# Patient Record
Sex: Female | Born: 1945 | Race: White | Hispanic: No | Marital: Married | Smoking: Never smoker
Health system: Southern US, Community
[De-identification: ages and names within clinical notes are randomized; demographics above are authoritative.]

## PROBLEM LIST (undated history)

## (undated) DIAGNOSIS — I2510 Atherosclerotic heart disease of native coronary artery without angina pectoris: Secondary | ICD-10-CM

## (undated) DIAGNOSIS — I712 Thoracic aortic aneurysm, without rupture: Secondary | ICD-10-CM

## (undated) DIAGNOSIS — I10 Essential (primary) hypertension: Secondary | ICD-10-CM

## (undated) DIAGNOSIS — F329 Major depressive disorder, single episode, unspecified: Secondary | ICD-10-CM

## (undated) DIAGNOSIS — E039 Hypothyroidism, unspecified: Secondary | ICD-10-CM

## (undated) DIAGNOSIS — C8590 Non-Hodgkin lymphoma, unspecified, unspecified site: Secondary | ICD-10-CM

## (undated) DIAGNOSIS — R569 Unspecified convulsions: Secondary | ICD-10-CM

## (undated) DIAGNOSIS — F419 Anxiety disorder, unspecified: Secondary | ICD-10-CM

## (undated) DIAGNOSIS — F32A Depression, unspecified: Secondary | ICD-10-CM

## (undated) DIAGNOSIS — K219 Gastro-esophageal reflux disease without esophagitis: Secondary | ICD-10-CM

## (undated) DIAGNOSIS — K589 Irritable bowel syndrome without diarrhea: Secondary | ICD-10-CM

## (undated) DIAGNOSIS — J309 Allergic rhinitis, unspecified: Secondary | ICD-10-CM

## (undated) HISTORY — DX: Gastro-esophageal reflux disease without esophagitis: K21.9

## (undated) HISTORY — DX: Allergic rhinitis, unspecified: J30.9

## (undated) HISTORY — DX: Hypothyroidism, unspecified: E03.9

## (undated) HISTORY — PX: WRIST CAPSULOTOMY: SHX1090

## (undated) HISTORY — DX: Non-Hodgkin lymphoma, unspecified, unspecified site: C85.90

## (undated) HISTORY — DX: Thoracic aortic aneurysm, without rupture: I71.2

## (undated) HISTORY — DX: Irritable bowel syndrome, unspecified: K58.9

## (undated) HISTORY — DX: Essential (primary) hypertension: I10

## (undated) HISTORY — DX: Atherosclerotic heart disease of native coronary artery without angina pectoris: I25.10

---
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
## 1975-01-06 HISTORY — PX: ABDOMINAL HYSTERECTOMY: SHX81

## 2001-01-05 DIAGNOSIS — I712 Thoracic aortic aneurysm, without rupture: Secondary | ICD-10-CM

## 2001-01-05 DIAGNOSIS — I7122 Aneurysm of the aortic arch, without rupture: Secondary | ICD-10-CM

## 2001-01-05 HISTORY — DX: Thoracic aortic aneurysm, without rupture: I71.2

## 2001-01-05 HISTORY — DX: Aneurysm of the aortic arch, without rupture: I71.22

## 2011-01-06 HISTORY — PX: LAMINECTOMY: SHX219

## 2012-05-16 ENCOUNTER — Encounter: Payer: BC Managed Care – PPO | Admitting: Internal Medicine

## 2012-05-26 ENCOUNTER — Encounter (INDEPENDENT_AMBULATORY_CARE_PROVIDER_SITE_OTHER): Payer: BC Managed Care – PPO | Admitting: Internal Medicine

## 2012-05-26 DIAGNOSIS — C8299 Follicular lymphoma, unspecified, extranodal and solid organ sites: Secondary | ICD-10-CM

## 2012-05-27 ENCOUNTER — Other Ambulatory Visit: Payer: Self-pay | Admitting: Otolaryngology

## 2012-05-27 DIAGNOSIS — H6980 Other specified disorders of Eustachian tube, unspecified ear: Secondary | ICD-10-CM

## 2012-05-27 DIAGNOSIS — H903 Sensorineural hearing loss, bilateral: Secondary | ICD-10-CM

## 2012-05-31 ENCOUNTER — Ambulatory Visit
Admission: RE | Admit: 2012-05-31 | Discharge: 2012-05-31 | Disposition: A | Discharge status: Home / Self Care | Payer: BC Managed Care – PPO | Source: Ambulatory Visit | Attending: Otolaryngology | Admitting: Otolaryngology

## 2012-05-31 DIAGNOSIS — H903 Sensorineural hearing loss, bilateral: Secondary | ICD-10-CM

## 2012-05-31 DIAGNOSIS — H6980 Other specified disorders of Eustachian tube, unspecified ear: Secondary | ICD-10-CM

## 2012-05-31 MED ORDER — IOHEXOL 300 MG/ML  SOLN: 100.0000 mL | Freq: Once | INTRAMUSCULAR | Status: DC | PRN

## 2012-05-31 MED ORDER — IOHEXOL 300 MG/ML  SOLN
75.0000 mL | Freq: Once | INTRAMUSCULAR | Status: AC | PRN
  Administered 2012-05-31: 75 mL via INTRAVENOUS

## 2013-06-08 ENCOUNTER — Encounter: Payer: Self-pay | Admitting: Cardiology

## 2013-06-13 ENCOUNTER — Inpatient Hospital Stay (HOSPITAL_COMMUNITY)
Admission: AD | Admit: 2013-06-13 | Discharge: 2013-06-15 | DRG: 287 | Disposition: A | Discharge status: Home / Self Care | Payer: Medicare Other | Source: Other Acute Inpatient Hospital | Attending: Internal Medicine | Admitting: Internal Medicine

## 2013-06-13 ENCOUNTER — Encounter: Payer: Self-pay | Admitting: *Deleted

## 2013-06-13 ENCOUNTER — Other Ambulatory Visit: Payer: Self-pay | Admitting: *Deleted

## 2013-06-13 ENCOUNTER — Encounter (HOSPITAL_COMMUNITY): Payer: Self-pay | Admitting: Internal Medicine

## 2013-06-13 ENCOUNTER — Ambulatory Visit (INDEPENDENT_AMBULATORY_CARE_PROVIDER_SITE_OTHER): Payer: Medicare Other | Admitting: Cardiology

## 2013-06-13 VITALS — BP 119/81 | HR 82 | Ht 66.0 in | Wt 225.0 lb

## 2013-06-13 DIAGNOSIS — Z79899 Other long term (current) drug therapy: Secondary | ICD-10-CM

## 2013-06-13 DIAGNOSIS — I708 Atherosclerosis of other arteries: Secondary | ICD-10-CM | POA: Diagnosis present

## 2013-06-13 DIAGNOSIS — R079 Chest pain, unspecified: Secondary | ICD-10-CM | POA: Insufficient documentation

## 2013-06-13 DIAGNOSIS — I959 Hypotension, unspecified: Secondary | ICD-10-CM | POA: Diagnosis not present

## 2013-06-13 DIAGNOSIS — I10 Essential (primary) hypertension: Secondary | ICD-10-CM

## 2013-06-13 DIAGNOSIS — K219 Gastro-esophageal reflux disease without esophagitis: Secondary | ICD-10-CM | POA: Diagnosis present

## 2013-06-13 DIAGNOSIS — E785 Hyperlipidemia, unspecified: Secondary | ICD-10-CM | POA: Diagnosis present

## 2013-06-13 DIAGNOSIS — C8589 Other specified types of non-Hodgkin lymphoma, extranodal and solid organ sites: Secondary | ICD-10-CM | POA: Diagnosis present

## 2013-06-13 DIAGNOSIS — E669 Obesity, unspecified: Secondary | ICD-10-CM

## 2013-06-13 DIAGNOSIS — E039 Hypothyroidism, unspecified: Secondary | ICD-10-CM | POA: Diagnosis present

## 2013-06-13 DIAGNOSIS — Z6835 Body mass index (BMI) 35.0-35.9, adult: Secondary | ICD-10-CM

## 2013-06-13 DIAGNOSIS — I200 Unstable angina: Secondary | ICD-10-CM

## 2013-06-13 HISTORY — DX: Essential (primary) hypertension: I10

## 2013-06-13 LAB — MRSA PCR SCREENING: MRSA by PCR: NEGATIVE

## 2013-06-13 MED ORDER — ATORVASTATIN CALCIUM 80 MG PO TABS
80.0000 mg | ORAL_TABLET | Freq: Every day | ORAL | Status: DC
  Administered 2013-06-14: 80 mg via ORAL
  Filled 2013-06-13 (×4): qty 1

## 2013-06-13 MED ORDER — ASPIRIN EC 81 MG PO TBEC
81.0000 mg | DELAYED_RELEASE_TABLET | Freq: Every day | ORAL | Status: DC
Start: 2013-06-14 — End: 2013-06-15
  Administered 2013-06-14: 81 mg via ORAL
  Filled 2013-06-13 (×2): qty 1

## 2013-06-13 MED ORDER — METOPROLOL SUCCINATE ER 50 MG PO TB24
50.0000 mg | ORAL_TABLET | Freq: Every day | ORAL | Status: DC
  Filled 2013-06-13: qty 1

## 2013-06-13 MED ORDER — SODIUM CHLORIDE 0.9 % IV SOLN
INTRAVENOUS | Status: DC
  Administered 2013-06-13: 22:00:00 via INTRAVENOUS

## 2013-06-13 MED ORDER — HEPARIN (PORCINE) IN NACL 100-0.45 UNIT/ML-% IJ SOLN
1000.0000 [IU]/h | INTRAMUSCULAR | Status: DC
  Filled 2013-06-13: qty 250

## 2013-06-13 MED ORDER — LEVOTHYROXINE SODIUM 100 MCG PO TABS
100.0000 ug | ORAL_TABLET | Freq: Every day | ORAL | Status: DC
  Administered 2013-06-14 – 2013-06-15 (×2): 100 ug via ORAL
  Filled 2013-06-13 (×4): qty 1

## 2013-06-13 MED ORDER — HEPARIN (PORCINE) IN NACL 100-0.45 UNIT/ML-% IJ SOLN
1350.0000 [IU]/h | INTRAMUSCULAR | Status: DC
  Administered 2013-06-13: 1150 [IU]/h via INTRAVENOUS
  Filled 2013-06-13 (×2): qty 250

## 2013-06-13 MED ORDER — LEVETIRACETAM 500 MG PO TABS
500.0000 mg | ORAL_TABLET | Freq: Two times a day (BID) | ORAL | Status: DC
  Administered 2013-06-14 (×3): 500 mg via ORAL
  Filled 2013-06-13 (×6): qty 1

## 2013-06-13 MED ORDER — ZOLPIDEM TARTRATE 5 MG PO TABS
5.0000 mg | ORAL_TABLET | Freq: Every evening | ORAL | Status: DC | PRN
  Administered 2013-06-14: 5 mg via ORAL
  Filled 2013-06-13: qty 1

## 2013-06-13 MED ORDER — NITROGLYCERIN 0.4 MG SL SUBL: 0.4000 mg | SUBLINGUAL_TABLET | SUBLINGUAL | Status: DC | PRN

## 2013-06-13 MED ORDER — HEPARIN BOLUS VIA INFUSION
4000.0000 [IU] | Freq: Once | INTRAVENOUS | Status: DC
  Filled 2013-06-13: qty 4000

## 2013-06-13 MED ORDER — FLUOXETINE HCL 20 MG PO CAPS
40.0000 mg | ORAL_CAPSULE | Freq: Every day | ORAL | Status: DC
  Administered 2013-06-14: 40 mg via ORAL
  Filled 2013-06-13 (×2): qty 2

## 2013-06-13 NOTE — Progress Notes (Addendum)
ANTICOAGULATION CONSULT NOTE - Initial Consult  Pharmacy Consult for Heparin Indication: chest pain/ACS  No Known Allergies  Patient Measurements: Height: 5\' 6"  (167.6 cm) Weight: 222 lb (100.699 kg) IBW/kg (Calculated) : 59.3 Heparin Dosing Weight: 82 kg  Vital Signs: Temp: 98.1 F (36.7 C) (06/09 1930) Temp src: Oral (06/09 1930) BP: 103/74 mmHg (06/09 2000) Pulse Rate: 58 (06/09 2000)  Labs: No results found for this basename: HGB, HCT, PLT, APTT, LABPROT, INR, HEPARINUNFRC, CREATININE, CKTOTAL, CKMB, TROPONINI,  in the last 72 hours  CrCl is unknown because no creatinine reading has been taken.   Medical History: Past Medical History  Diagnosis Date  . CAD (coronary artery disease)   . GERD (gastroesophageal reflux disease)   . Hypothyroidism   . Non Hodgkin's lymphoma   . IBS (irritable bowel syndrome)   . 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 Unspecified essential hypertension   . Allergic rhinitis   . HTN (hypertension) 06/13/2013    Assesment: CP over last several weeks 68 y/o F concerning for unstable/accelerating angina in setting of progressing symptoms and abnormal nuclear stress test. LVEF 61%. Patient has a known h/o CAD. Baseline labs pending.   Anticoag: Heparin infusion started at [facility] around 1530 per paperwork. I can not recognized if a bolus was administered or not. Per RN report heparin was turned off by EMS for unknown length of time during transport and until a pump/pole was obtained here. Rate from [facility] is 1160 units/hr (40 uts/ml x 29 ml/hr).   [place] labs: Scr 0.81, H/H 134./40.2, Plts 175  Goal of Therapy:  Heparin level 0.3-0.7 units/ml Monitor platelets by anticoagulation protocol: Yes   Plan:  1. Will convert patient over to our concentration of 100 units/ml and run at 1150 units/hr. Check heparin level in 6 hrs due to unknown time since heparin was resumed.  2. Daily heparin level and CBC   Jaidon Sponsel S. Alford Highland, PharmD,  [facility] Clinical Staff Pharmacist Pager [phone]  Wayland Salinas 06/13/2013,10:17 PM

## 2013-06-13 NOTE — H&P (Signed)
Cardiology History and Physical  SHAH,ASHISH, MD  History of Present Illness (and review of medical records): Jamie Hurst is a 68 y.o. female who presents for evaluation of chest pain.  She has hx of HTN, GERD, hypothyroidism, and obesity.  She recently saw her PCP 2 wks prior for chest pain.  She has been having episodes of chest pain that feel like heaviness across chest.  She has had dyspnea on exertion which is new.  She had severe episode while walking in mall, felt like 8/10 pain, associated with diaphoresis.  Symptoms have been becoming more frequent.  She had been referred for Lexiscan MPI 06/2013 which showed moderate anterior and apical defect with moderate reversibility, LVEF 61%.  She was started on metoporlol, started ASA 81 mg daily by her pcp.  She had initial appointment today with Dr. Harl Bowie in [place] was sent to ED for further evaluation.  She was subsequently transferred to [facility] for further management.  Previous diagnostic testing for coronary artery disease includes: stress thallium. Previous history of cardiac disease includes Angina. Coronary artery disease risk factors include: advanced age (older than 35 for men, 38 for women), hypertension and obesity (BMI >= 30 kg/m2). Patient denies history of CHF, coronary angioplasty, coronary artery stent, previous M.I. and valvular disease.  Review of Systems Further review of systems was otherwise negative other than stated in HPI.  Patient Active Problem List   Diagnosis Date Noted  . Chest pain 06/13/2013  . Unstable angina 06/13/2013  . HTN (hypertension) 06/13/2013  . GERD (gastroesophageal reflux disease) 06/13/2013  . Hypothyroid 06/13/2013   Past Medical History  Diagnosis Date  . CAD (coronary artery disease)   . GERD (gastroesophageal reflux disease)   . Hypothyroidism   . Non Hodgkin's lymphoma   . IBS (irritable bowel syndrome)   . Aneurysm of aortic arch 2003    58mm  . Unspecified essential hypertension   .  Allergic rhinitis   . HTN (hypertension) 06/13/2013    Past Surgical History  Procedure Laterality Date  . Thoracotomy  1968    age 56  . Abdominal hysterectomy  1977  . Laminectomy  2013    x's 2  1st 2003 and 2nd 2013    Prescriptions prior to admission  Medication Sig Dispense Refill  . Butalbital-APAP-Caffeine 50-325-40 MG per capsule Take 1 capsule by mouth daily as needed.      . diphenoxylate-atropine (LOMOTIL) 2.5-0.025 MG per tablet Take 1 tablet by mouth 3 (three) times daily as needed.      Marland Kitchen FLUoxetine (PROZAC) 40 MG capsule Take 40 mg by mouth daily.      Marland Kitchen levETIRAcetam (KEPPRA) 500 MG tablet Take 1 tablet by mouth 2 (two) times daily.      Marland Kitchen levothyroxine (SYNTHROID, LEVOTHROID) 100 MCG tablet Take 1 tablet by mouth daily.      Marland Kitchen LORazepam (ATIVAN) 0.5 MG tablet Take 1 tablet by mouth 2 (two) times daily as needed.       . methocarbamol (ROBAXIN) 500 MG tablet Take 1 tablet by mouth every 8 (eight) hours as needed.       . metoprolol succinate (TOPROL-XL) 50 MG 24 hr tablet Take 1 tablet by mouth daily.      Marland Kitchen zolpidem (AMBIEN) 10 MG tablet Take 1 tablet by mouth daily.       No Known Allergies  History  Substance Use Topics  . Smoking status: Never Smoker   . Smokeless tobacco: Never Used  . Alcohol  Use: Not on file    No family history on file.   Objective:  Patient Vitals for the past 8 hrs:  BP Temp Temp src Pulse Resp SpO2 Height Weight  06/13/13 2000 103/74 mmHg - - 58 16 97 % - -  06/13/13 1930 115/73 mmHg 98.1 F (36.7 C) Oral 59 14 100 % 5\' 6"  (1.676 m) 100.699 kg (222 lb)   General appearance: alert, cooperative, appears stated age and no distress Head: Normocephalic, without obvious abnormality, atraumatic Eyes: conjunctivae/corneas clear. PERRL, EOM's intact. Fundi benign. Neck: no carotid bruit, no JVD and supple, symmetrical, trachea midline Lungs: clear to auscultation bilaterally Chest wall: no tenderness Heart: regular rate and rhythm, S1,  S2 normal, no murmur, click, rub or gallop Abdomen: soft, non-tender; bowel sounds normal; no masses,  no organomegaly Extremities: extremities normal, atraumatic, no cyanosis or edema Pulses: 2+ and symmetric Neurologic: Grossly normal  Results for orders placed during the hospital encounter of 06/13/13 (from the past 48 hour(s))  MRSA PCR SCREENING     Status: None   Collection Time    06/13/13  9:20 PM      Result Value Ref Range   MRSA by PCR NEGATIVE  NEGATIVE   Comment:            The GeneXpert MRSA Assay (FDA     approved for NASAL specimens     only), is one component of a     comprehensive MRSA colonization     surveillance program. It is not     intended to diagnose MRSA     infection nor to guide or     monitor treatment for     MRSA infections.   No results found.  ECG:  Pending, normal sinus rhythm on telemetry  Assessment: Unstable angina with abnormal stress test HTN Obesity Hypothyroidism  Plan: 1. Cardiology Admission  2. Continuous monitoring on Telemetry. 3. Repeat ekg on admit, prn chest pain or arrythmia 4. cycle cardiac biomarkers, check lipids, hgba1c, tsh 5. Medical management to include ASA, Heparin gt, BB, Statin, NTG prn 6. Keep NPO for further likely invasive assessment with cardiac catheterization. 7. Patient agreeable to proceed.

## 2013-06-13 NOTE — Progress Notes (Signed)
Received patient into 2c15 with c/o chest pain. No pain upon arrival. Oriented to rm. CHG bath completed. Iv pump ordered to infuse Heparin drip. End of shift report given to Kathyrn Drown.

## 2013-06-13 NOTE — Patient Instructions (Signed)
   To [facility] ED for evaluation per Dr. Harl Bowie

## 2013-06-13 NOTE — Progress Notes (Signed)
Clinical Summary Jamie Hurst is a 68 y.o.female seen today as a new patient.   1. Chest pain - started approx 3 weeks ago. Episode while in mall where she  became very diaphoretic. Aching pain in midchest, 8/10. No other symptoms. Lasted just a few minutes and then resolved on its own.  - repeat episode last weekend, discomfort in chest while at rest. Not positional, not pleuritic.  - approx 5 episodes over the last few weeks. Has noted some increased DOE and fatigue as well over this same time period. Chest discomfort on Sunday, as well as some mild intermittent symptoms today.     - seen by her pcp Dr Manuella Ghazi initially, referred for [facility] MPI 06/2013 which showed moderate anterior and apical defect with moderate reversibility, LVEF 61%. Dtarted on metoporlol, started ASA 81 mg daily by her pcp and referred to [place].  CAD risk factors: maternal grandfather MIs unsure age , paternal grandfather MI age 81. Brother MI 17. Maternal uncle MI early 39s    Past Medical History  Diagnosis Date  . CAD (coronary artery disease)   . GERD (gastroesophageal reflux disease)   . Hypothyroidism   . Non Hodgkin's lymphoma   . IBS (irritable bowel syndrome)   . Aneurysm of aortic arch 2003    40mm  . Unspecified essential hypertension   . Allergic rhinitis      Allergies no known allergies   Current Outpatient Prescriptions  Medication Sig Dispense Refill  . atenolol (TENORMIN) 50 MG tablet Take 1 tablet by mouth daily.      . Butalbital-APAP-Caffeine 50-325-40 MG per capsule Take 1 capsule by mouth daily as needed.      . diphenoxylate-atropine (LOMOTIL) 2.5-0.025 MG per tablet Take 1 tablet by mouth 3 (three) times daily as needed.      . levETIRAcetam (KEPPRA) 500 MG tablet Take 1 tablet by mouth 2 (two) times daily.      Marland Kitchen levothyroxine (SYNTHROID, LEVOTHROID) 100 MCG tablet Take 1 tablet by mouth daily.      Marland Kitchen LORazepam (ATIVAN) 0.5 MG tablet Take 1 tablet by mouth 2 (two) times  daily.      . methocarbamol (ROBAXIN) 500 MG tablet Take 1 tablet by mouth 3 (three) times daily.      . metoprolol succinate (TOPROL-XL) 50 MG 24 hr tablet Take 1 tablet by mouth daily.      Marland Kitchen zolpidem (AMBIEN) 10 MG tablet Take 1 tablet by mouth daily.       No current facility-administered medications for this visit.     No past surgical history on file.   Allergies no known allergies    No family history on file.   Social History Jamie Hurst has no tobacco history on file. Jamie Hurst has no alcohol history on file.   Review of Systems CONSTITUTIONAL: No weight loss, fever, chills, weakness or fatigue.  HEENT: Eyes: No visual loss, blurred vision, double vision or yellow sclerae.No hearing loss, sneezing, congestion, runny nose or sore throat.  SKIN: No rash or itching.  CARDIOVASCULAR: per HPI RESPIRATORY: No shortness of breath, cough or sputum.  GASTROINTESTINAL: No anorexia, nausea, vomiting or diarrhea. No abdominal pain or blood.  GENITOURINARY: No burning on urination, no polyuria NEUROLOGICAL: No headache, dizziness, syncope, paralysis, ataxia, numbness or tingling in the extremities. No change in bowel or bladder control.  MUSCULOSKELETAL: No muscle, back pain, joint pain or stiffness.  LYMPHATICS: No enlarged nodes. No history of splenectomy.  PSYCHIATRIC:  No history of depression or anxiety.  ENDOCRINOLOGIC: No reports of sweating, cold or heat intolerance. No polyuria or polydipsia.  Marland Kitchen   Physical Examination p 82 bp 119/81 Wt 225 lbs BMI 36 Gen: resting comfortably, no acute distress HEENT: no scleral icterus, pupils equal round and reactive, no palptable cervical adenopathy,  CV: RRR, no m/r/g, no JVD, no carotid bruits Resp: Clear to auscultation bilaterally GI: abdomen is soft, non-tender, non-distended, normal bowel sounds, no hepatosplenomegaly MSK: extremities are warm, no edema.  Skin: warm, no rash Neuro:  no focal deficits Psych:  appropriate affect   Diagnostic Studies  Pertinent labs 06/06/13 Hgb 12.8 Hct 38.9 Plt 233 D-dimer 0.5, TSH 3.1, Cr 0.9 BUN 14 GFR 67 K 5.1  06/08/13 Lexiscan MPI Moderate defect in anterior wall and apex with moderate reversibility, LVEF 61%, normal wall motion.   06/13/13 Clinic EKG NSR, no acute ischemic changes.  Assessment and Plan  1. Chest pain - concerning for unstable/accelerating angina in setting of progressing symptoms and abnormal nuclear stress test. - discussed with patient in detail. Will have her go to [facility] ER to initiate medical management with plans for transfer to [facility] ultimately for cath - discussed with [facility] ER attending these plans as well.      Arnoldo Lenis, M.D., F.A.C.C.

## 2013-06-13 NOTE — Progress Notes (Signed)
Dr. Colon Flattery notified of pt arrival.

## 2013-06-14 ENCOUNTER — Encounter (HOSPITAL_COMMUNITY)
Admission: AD | Disposition: A | Discharge status: Home / Self Care | Payer: Self-pay | Source: Other Acute Inpatient Hospital | Attending: Internal Medicine

## 2013-06-14 ENCOUNTER — Encounter (HOSPITAL_COMMUNITY): Payer: Self-pay | Admitting: *Deleted

## 2013-06-14 ENCOUNTER — Other Ambulatory Visit: Payer: Self-pay

## 2013-06-14 DIAGNOSIS — I200 Unstable angina: Principal | ICD-10-CM

## 2013-06-14 DIAGNOSIS — R9430 Abnormal result of cardiovascular function study, unspecified: Secondary | ICD-10-CM

## 2013-06-14 DIAGNOSIS — E669 Obesity, unspecified: Secondary | ICD-10-CM

## 2013-06-14 DIAGNOSIS — E785 Hyperlipidemia, unspecified: Secondary | ICD-10-CM

## 2013-06-14 HISTORY — PX: LEFT HEART CATHETERIZATION WITH CORONARY ANGIOGRAM: SHX5451

## 2013-06-14 LAB — CBC
HEMATOCRIT: 38 % (ref 36.0–46.0)
Hemoglobin: 11.9 g/dL — ABNORMAL LOW (ref 12.0–15.0)
MCH: 28.7 pg (ref 26.0–34.0)
MCHC: 31.3 g/dL (ref 30.0–36.0)
MCV: 91.8 fL (ref 78.0–100.0)
Platelets: 154 10*3/uL (ref 150–400)
RBC: 4.14 MIL/uL (ref 3.87–5.11)
RDW: 13.5 % (ref 11.5–15.5)
WBC: 3.2 10*3/uL — ABNORMAL LOW (ref 4.0–10.5)

## 2013-06-14 LAB — BASIC METABOLIC PANEL
BUN: 12 mg/dL (ref 6–23)
CO2: 23 mEq/L (ref 19–32)
Calcium: 9.1 mg/dL (ref 8.4–10.5)
Chloride: 107 mEq/L (ref 96–112)
Creatinine, Ser: 0.73 mg/dL (ref 0.50–1.10)
GFR, EST NON AFRICAN AMERICAN: 88 mL/min — AB (ref >90)
Glucose, Bld: 94 mg/dL (ref 70–99)
Potassium: 4.2 mEq/L (ref 3.7–5.3)
Sodium: 143 mEq/L (ref 137–147)

## 2013-06-14 LAB — PROTIME-INR
INR: 1.04 (ref 0.00–1.49)
Prothrombin Time: 13.4 seconds (ref 11.6–15.2)

## 2013-06-14 LAB — HEPARIN LEVEL (UNFRACTIONATED)
HEPARIN UNFRACTIONATED: 0.17 [IU]/mL — AB (ref 0.30–0.70)
Heparin Unfractionated: 0.57 IU/mL (ref 0.30–0.70)

## 2013-06-14 LAB — POCT ACTIVATED CLOTTING TIME: ACTIVATED CLOTTING TIME: 116 s

## 2013-06-14 LAB — TROPONIN I
Troponin I: <0.3 ng/mL (ref <0.30)
Troponin I: <0.3 ng/mL (ref <0.30)
Troponin I: <0.3 ng/mL (ref <0.30)

## 2013-06-14 LAB — LIPID PANEL
Cholesterol: 236 mg/dL — ABNORMAL HIGH (ref 0–200)
HDL: 68 mg/dL (ref >39)
LDL CALC: 134 mg/dL — AB (ref 0–99)
TRIGLYCERIDES: 168 mg/dL — AB (ref <150)
Total CHOL/HDL Ratio: 3.5 RATIO
VLDL: 34 mg/dL (ref 0–40)

## 2013-06-14 LAB — GLUCOSE, CAPILLARY: Glucose-Capillary: 82 mg/dL (ref 70–99)

## 2013-06-14 LAB — MAGNESIUM: Magnesium: 2 mg/dL (ref 1.5–2.5)

## 2013-06-14 LAB — HEMOGLOBIN A1C
Hgb A1c MFr Bld: 5.6 % (ref <5.7)
Mean Plasma Glucose: 114 mg/dL (ref <117)

## 2013-06-14 SURGERY — LEFT HEART CATHETERIZATION WITH CORONARY ANGIOGRAM
Anesthesia: LOCAL

## 2013-06-14 MED ORDER — ASPIRIN 81 MG PO CHEW: 81.0000 mg | CHEWABLE_TABLET | ORAL | Status: DC

## 2013-06-14 MED ORDER — AMLODIPINE BESYLATE 2.5 MG PO TABS
2.5000 mg | ORAL_TABLET | Freq: Every day | ORAL | Status: DC
  Administered 2013-06-14 – 2013-06-15 (×2): 2.5 mg via ORAL
  Filled 2013-06-14 (×2): qty 1

## 2013-06-14 MED ORDER — NITROGLYCERIN 0.2 MG/ML ON CALL CATH LAB
INTRAVENOUS | Status: AC
  Filled 2013-06-14: qty 1

## 2013-06-14 MED ORDER — HEPARIN (PORCINE) IN NACL 2-0.9 UNIT/ML-% IJ SOLN
INTRAMUSCULAR | Status: AC
  Filled 2013-06-14: qty 1500

## 2013-06-14 MED ORDER — MIDAZOLAM HCL 2 MG/2ML IJ SOLN
INTRAMUSCULAR | Status: AC
  Filled 2013-06-14: qty 2

## 2013-06-14 MED ORDER — LIDOCAINE HCL (PF) 1 % IJ SOLN
INTRAMUSCULAR | Status: AC
  Filled 2013-06-14: qty 30

## 2013-06-14 MED ORDER — ASPIRIN 81 MG PO CHEW
81.0000 mg | CHEWABLE_TABLET | Freq: Every day | ORAL | Status: DC
  Administered 2013-06-15: 09:00:00 81 mg via ORAL
  Filled 2013-06-14: qty 1

## 2013-06-14 MED ORDER — FENTANYL CITRATE 0.05 MG/ML IJ SOLN
INTRAMUSCULAR | Status: AC
  Filled 2013-06-14: qty 2

## 2013-06-14 MED ORDER — SODIUM CHLORIDE 0.9 % IV SOLN
250.0000 mL | INTRAVENOUS | Status: DC | PRN
Start: 2013-06-14 — End: 2013-06-14

## 2013-06-14 MED ORDER — SODIUM CHLORIDE 0.9 % IJ SOLN
3.0000 mL | Freq: Two times a day (BID) | INTRAMUSCULAR | Status: DC
  Administered 2013-06-14: 3 mL via INTRAVENOUS

## 2013-06-14 MED ORDER — SODIUM CHLORIDE 0.9 % IV SOLN: 1.0000 mL/kg/h | INTRAVENOUS | Status: AC

## 2013-06-14 MED ORDER — SODIUM CHLORIDE 0.9 % IV SOLN: INTRAVENOUS | Status: DC

## 2013-06-14 MED ORDER — ACETAMINOPHEN 500 MG PO TABS: 1000.0000 mg | ORAL_TABLET | Freq: Four times a day (QID) | ORAL | Status: DC | PRN

## 2013-06-14 MED ORDER — HYDROCODONE-ACETAMINOPHEN 5-325 MG PO TABS
1.0000 | ORAL_TABLET | Freq: Once | ORAL | Status: AC
  Administered 2013-06-14: 21:00:00 1 via ORAL
  Filled 2013-06-14: qty 1

## 2013-06-14 MED ORDER — SODIUM CHLORIDE 0.9 % IJ SOLN
3.0000 mL | INTRAMUSCULAR | Status: DC | PRN
Start: 2013-06-14 — End: 2013-06-14

## 2013-06-14 MED ORDER — METOPROLOL SUCCINATE ER 25 MG PO TB24
25.0000 mg | ORAL_TABLET | Freq: Every day | ORAL | Status: DC
  Filled 2013-06-14: qty 1

## 2013-06-14 NOTE — Progress Notes (Signed)
ANTICOAGULATION CONSULT NOTE - Follow Up Consult  Pharmacy Consult for Heparin  Indication: chest pain/ACS  No Known Allergies  Patient Measurements: Height: 5\' 6"  (167.6 cm) Weight: 222 lb (100.699 kg) IBW/kg (Calculated) : 59.3 Heparin Dosing Weight: 82 kg  Vital Signs: Temp: 97.8 F (36.6 C) (06/10 1100) Temp src: Oral (06/10 0739) BP: 108/74 mmHg (06/10 1200) Pulse Rate: 67 (06/10 1200)  Labs:  Recent Labs  06/13/13 2251 06/14/13 0323 06/14/13 1100 06/14/13 1310  HGB  --  11.9*  --   --   HCT  --  38.0  --   --   PLT  --  154  --   --   LABPROT  --  13.4  --   --   INR  --  1.04  --   --   HEPARINUNFRC  --  0.17*  --  0.57  CREATININE  --  0.73  --   --   TROPONINI <0.30 <0.30 <0.30  --    Medications:  Heparin gtt  Assessment: 68 y/o F on heparin for chest pain , heparin level is therapeutic.  CBC is stable, mild anemia noted. No acute bleeding.  Goal of Therapy:  Heparin level 0.3-0.7 units/ml Monitor platelets by anticoagulation protocol: Yes   Plan:  -Continue heparin gtt 1350 units/hr -Daily CBC/HL -Monitor for s/sx bleeding    Hughes Better, PharmD, BCPS Clinical Pharmacist Pager: [phone] 06/14/2013 1:47 PM

## 2013-06-14 NOTE — Progress Notes (Signed)
Utilization Review Completed.  

## 2013-06-14 NOTE — H&P (View-Only) (Signed)
Subjective: 5/10 CP currently  Objective: Vital signs in last 24 hours: Temp:  [97.6 F (36.4 C)-98.1 F (36.7 C)] 97.6 F (36.4 C) (06/10 0739) Pulse Rate:  [54-82] 70 (06/10 0739) Resp:  [13-18] 14 (06/10 0739) BP: (101-119)/(56-81) 110/66 mmHg (06/10 0739) SpO2:  [95 %-100 %] 99 % (06/10 0739) Weight:  [222 lb (100.699 kg)-225 lb (102.059 kg)] 222 lb (100.699 kg) (06/09 1930) Last BM Date: 06/12/13  Intake/Output from previous day: 06/09 0701 - 06/10 0700 In: 656.8 [I.V.:656.8] Out: 500 [Urine:500] Intake/Output this shift:    Medications Current Facility-Administered Medications  Medication Dose Route Frequency Provider Last Rate Last Dose  . aspirin EC tablet 81 mg  81 mg Oral Daily Alwyn Pea, MD      . atorvastatin (LIPITOR) tablet 80 mg  80 mg Oral q1800 Alwyn Pea, MD   80 mg at 06/14/13 0014  . FLUoxetine (PROZAC) capsule 40 mg  40 mg Oral Daily Alwyn Pea, MD      . heparin ADULT infusion 100 units/mL (25000 units/250 mL)  1,350 Units/hr Intravenous Continuous Narda Bonds, RPH 13.5 mL/hr at 06/14/13 0700 1,350 Units/hr at 06/14/13 0700  . levETIRAcetam (KEPPRA) tablet 500 mg  500 mg Oral BID Alwyn Pea, MD   500 mg at 06/14/13 0014  . levothyroxine (SYNTHROID, LEVOTHROID) tablet 100 mcg  100 mcg Oral QAC breakfast Alwyn Pea, MD   100 mcg at 06/14/13 4403  . metoprolol succinate (TOPROL-XL) 24 hr tablet 50 mg  50 mg Oral Daily Alwyn Pea, MD      . nitroGLYCERIN (NITROSTAT) SL tablet 0.4 mg  0.4 mg Sublingual Q5 Min x 3 PRN Alwyn Pea, MD      . zolpidem (AMBIEN) tablet 5 mg  5 mg Oral QHS PRN Alwyn Pea, MD   5 mg at 06/14/13 0014    PE: General appearance: alert, cooperative and no distress Lungs: clear to auscultation bilaterally Heart: regular rate and rhythm, S1, S2 normal, no murmur, click, rub or gallop Extremities: No LEE Pulses: 2+ and symmetric Skin: Warm and dry Neurologic: Grossly  normal  Lab Results:   Recent Labs  06/14/13 0323  WBC 3.2*  HGB 11.9*  HCT 38.0  PLT 154   BMET  Recent Labs  06/14/13 0323  NA 143  K 4.2  CL 107  CO2 23  GLUCOSE 94  BUN 12  CREATININE 0.73  CALCIUM 9.1   PT/INR  Recent Labs  06/14/13 0323  LABPROT 13.4  INR 1.04   Cholesterol  Recent Labs  06/14/13 0323  CHOL 236*   Lipid Panel     Component Value Date/Time   CHOL 236* 06/14/2013 0323   TRIG 168* 06/14/2013 0323   HDL 68 06/14/2013 0323   CHOLHDL 3.5 06/14/2013 0323   VLDL 34 06/14/2013 0323   LDLCALC 134* 06/14/2013 0323     Cardiac Panel (last 3 results)  Recent Labs  06/13/13 2251 06/14/13 0323  TROPONINI <0.30 <0.30       Assessment/Plan 69 y.o. female who presents for evaluation of chest pain. She has hx of HTN, GERD, hypothyroidism, and obesity. She recently saw her PCP 2 wks prior for chest pain. She has been having episodes of chest pain that feel like heaviness across chest. She has had dyspnea on exertion which is new. She had severe episode while walking in mall, felt like 8/10 pain, associated with diaphoresis. Symptoms have been becoming more frequent. She had been referred for [facility] MPI 06/2013 which  showed moderate anterior and apical defect with moderate reversibility, LVEF 61%. She was started on metoporlol, started ASA 81 mg daily by her pcp. She had initial appointment today with Dr. Harl Bowie in [place] was sent to ED for further evaluation. She was subsequently transferred to [facility] for further management.  Principal Problem:   Unstable angina No MI thus far but is still having 5/10 CP.   Recent positive nuc at [facility] hosp.(moderate anterior and apical defect with moderate reversibility, LVEF 61%.).  Left heart cath today.  NPO now.  I would favor adding IV NTG if her BP would tolerate it but currently she is low.   On IV heparin.  No acute EKG changes othe than left axis deviation.   Active Problems:   HTN  (hypertension)  Hypotensive this morning.  Has not has Toprol 50 yet this morning.  Will decrease to 25mg  with parameters.     HLD    Lipitor 80mg .      Obesity  Discussed weight loss,  Carb restriction   GERD (gastroesophageal reflux disease)   Hypothyroid  On levothyroxine    LOS: 1 day    HAGER, BRYAN PA-C 06/14/2013 10:39 AM   Patient seen and examined. Agree with assessment and plan. Very pleasant 68 yo WF with a several week history of exertional chest pressure and recent scintigraphic evidence of anterior and apical defect on nuclear imaging. She presents with unstable angina and has residual discomfort presently. Initial troponins are negative and ECG's are without changes. She is currently on iv heparin; BP in mid 90's. Will hydrate with NS and if BP allows start NTG. Discussed cath and possible PCI with pt and husband. 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 She also underwent thorocotomy at age 33. I have added pt to cath schedule for today.   Troy Sine, MD, [facility] 06/14/2013 11:19 AM

## 2013-06-14 NOTE — CV Procedure (Signed)
PROCEDURE:  Left heart catheterization with selective coronary angiography, left ventriculogram. Ascending aortogram.  Right upper extremity angiogram.  INDICATIONS:  Abnormal stress test, unstable angina  The risks, benefits, and details of the procedure were explained to the patient.  The patient verbalized understanding and wanted to proceed.  Informed written consent was obtained.  PROCEDURE TECHNIQUE:  After Xylocaine anesthesia a 62F slender sheath was placed in the right radial artery with a single anterior needle wall stick.  We're unable to pass a wire to the descending aorta. Through the JR 4 catheter, hand injection of contrast was performed. 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 The right groin was prepped and draped in a sterile fashion. Subcutaneous lidocaine anesthesia was given. A 5 French sheath was placed into the right common femoral arteries the modified Seldinger technique. Right coronary angiography was done using a Judkins R4 guide catheter.  Left coronary angiography was done using a Judkins L3.5 guide catheter.  To engage the left circumflex, a JL4 catheter was used. There were separate ostia of the left circumflex and LAD. Left ventriculography was done using a pigtail catheter.  The catheter was pulled back. A power injection of contrast through pigtail catheter was used to perform the aortogram. A TR band was used for hemostasis.  Manual compression will be used for the right femoral sheath.   CONTRAST:  Total of 120 cc.  COMPLICATIONS:  None.    HEMODYNAMICS:  Aortic pressure was 116/66; LV pressure was 118/2; LVEDP 10.  There was no gradient between the left ventricle and aorta.    ANGIOGRAPHIC DATA:   The left main coronary artery is absent.  The left anterior descending artery is a large vessel which reaches the apex. There is a large diagonal vessel which is widely patent.  There was catheter-induced spasm in the proximal LAD. This  improved with intracoronary nitroglycerin.  The left circumflex artery is a large vessel.  There is a large first obtuse marginal which is widely patent. The circumflex system appears angiographically normal.  The right coronary artery is a large vessel. There is a large posterior descending artery. The posterior lateral artery is large as well. The RCA system appears angiographically normal.  LEFT VENTRICULOGRAM:  Left ventricular angiogram was done in the 30 RAO projection and revealed normal left ventricular wall motion and systolic function with an estimated ejection fraction of 60 %.  LVEDP was 10 mmHg.  ASCENDING AORTOGRAM: There is no evidence of aneurysm. The innominate artery fills and contrast is seen in the carotid. There appears to be some late filling of contrast in the proximal subclavian.   IMPRESSIONS:  1. Absent left main coronary artery.  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 2. Widely patent left anterior descending artery and its branches.  Catheter-induced spasm noted in the proximal LAD which improved with IC nitroglycerin. 3. Normal left circumflex artery and its branches. 4. Normal right coronary artery. 5. Normal left ventricular systolic function.  LVEDP 10 mmHg.  Ejection fraction 60%. 6.  Occluded native right subclavian artery. Of note, she had a vascular surgery procedure when she was a teenager where she states a vessel had to be clipped. 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  RECOMMENDATION:  Continue aggressive medical therapy and risk factor modification. I would put her on low-dose amlodipine to help with potential vasospasm. She will be watched overnight.  Followup with Dr.  Branch.

## 2013-06-14 NOTE — Progress Notes (Signed)
ANTICOAGULATION CONSULT NOTE - Follow Up Consult  Pharmacy Consult for Heparin  Indication: chest pain/ACS  No Known Allergies  Patient Measurements: Height: 5\' 6"  (167.6 cm) Weight: 222 lb (100.699 kg) IBW/kg (Calculated) : 59.3 Heparin Dosing Weight: 82 kg  Vital Signs: Temp: 98 F (36.7 C) (06/09 2340) Temp src: Oral (06/09 2340) BP: 106/67 mmHg (06/10 0200) Pulse Rate: 64 (06/10 0200)  Labs:  Recent Labs  06/13/13 2251 06/14/13 0323  HGB  --  11.9*  HCT  --  38.0  PLT  --  154  LABPROT  --  13.4  INR  --  1.04  HEPARINUNFRC  --  0.17*  TROPONINI <0.30  --    Medications:  Heparin 1150 units/hr  Assessment: 68 y/o F on heparin for CP. HL is 0.17. Other labs as above.   Goal of Therapy:  Heparin level 0.3-0.7 units/ml Monitor platelets by anticoagulation protocol: Yes   Plan:  -Increase heparin drip to 1350 units/hr -1200 HL -Daily CBC/HL -Monitor for bleeding  Narda Bonds 06/14/2013,4:28 AM

## 2013-06-14 NOTE — Progress Notes (Signed)
Report called to receiving nurse post cardiac catheterization. All questions answered. Past medical history and present hospitalization course reported. All belongings given to husband.

## 2013-06-14 NOTE — Progress Notes (Signed)
Subjective: 5/10 CP currently  Objective: Vital signs in last 24 hours: Temp:  [97.6 F (36.4 C)-98.1 F (36.7 C)] 97.6 F (36.4 C) (06/10 0739) Pulse Rate:  [54-82] 70 (06/10 0739) Resp:  [13-18] 14 (06/10 0739) BP: (101-119)/(56-81) 110/66 mmHg (06/10 0739) SpO2:  [95 %-100 %] 99 % (06/10 0739) Weight:  [222 lb (100.699 kg)-225 lb (102.059 kg)] 222 lb (100.699 kg) (06/09 1930) Last BM Date: 06/12/13  Intake/Output from previous day: 06/09 0701 - 06/10 0700 In: 656.8 [I.V.:656.8] Out: 500 [Urine:500] Intake/Output this shift:    Medications Current Facility-Administered Medications  Medication Dose Route Frequency Provider Last Rate Last Dose  . aspirin EC tablet 81 mg  81 mg Oral Daily Alwyn Pea, MD      . atorvastatin (LIPITOR) tablet 80 mg  80 mg Oral q1800 Alwyn Pea, MD   80 mg at 06/14/13 0014  . FLUoxetine (PROZAC) capsule 40 mg  40 mg Oral Daily Alwyn Pea, MD      . heparin ADULT infusion 100 units/mL (25000 units/250 mL)  1,350 Units/hr Intravenous Continuous Narda Bonds, RPH 13.5 mL/hr at 06/14/13 0700 1,350 Units/hr at 06/14/13 0700  . levETIRAcetam (KEPPRA) tablet 500 mg  500 mg Oral BID Alwyn Pea, MD   500 mg at 06/14/13 0014  . levothyroxine (SYNTHROID, LEVOTHROID) tablet 100 mcg  100 mcg Oral QAC breakfast Alwyn Pea, MD   100 mcg at 06/14/13 1962  . metoprolol succinate (TOPROL-XL) 24 hr tablet 50 mg  50 mg Oral Daily Alwyn Pea, MD      . nitroGLYCERIN (NITROSTAT) SL tablet 0.4 mg  0.4 mg Sublingual Q5 Min x 3 PRN Alwyn Pea, MD      . zolpidem (AMBIEN) tablet 5 mg  5 mg Oral QHS PRN Alwyn Pea, MD   5 mg at 06/14/13 0014    PE: General appearance: alert, cooperative and no distress Lungs: clear to auscultation bilaterally Heart: regular rate and rhythm, S1, S2 normal, no murmur, click, rub or gallop Extremities: No LEE Pulses: 2+ and symmetric Skin: Warm and dry Neurologic: Grossly  normal  Lab Results:   Recent Labs  06/14/13 0323  WBC 3.2*  HGB 11.9*  HCT 38.0  PLT 154   BMET  Recent Labs  06/14/13 0323  NA 143  K 4.2  CL 107  CO2 23  GLUCOSE 94  BUN 12  CREATININE 0.73  CALCIUM 9.1   PT/INR  Recent Labs  06/14/13 0323  LABPROT 13.4  INR 1.04   Cholesterol  Recent Labs  06/14/13 0323  CHOL 236*   Lipid Panel     Component Value Date/Time   CHOL 236* 06/14/2013 0323   TRIG 168* 06/14/2013 0323   HDL 68 06/14/2013 0323   CHOLHDL 3.5 06/14/2013 0323   VLDL 34 06/14/2013 0323   LDLCALC 134* 06/14/2013 0323     Cardiac Panel (last 3 results)  Recent Labs  06/13/13 2251 06/14/13 0323  TROPONINI <0.30 <0.30       Assessment/Plan 68 y.o. female who presents for evaluation of chest pain. She has hx of HTN, GERD, hypothyroidism, and obesity. She recently saw her PCP 2 wks prior for chest pain. She has been having episodes of chest pain that feel like heaviness across chest. She has had dyspnea on exertion which is new. She had severe episode while walking in mall, felt like 8/10 pain, associated with diaphoresis. Symptoms have been becoming more frequent. She had been referred for [facility] MPI 06/2013 which  showed moderate anterior and apical defect with moderate reversibility, LVEF 61%. She was started on metoporlol, started ASA 81 mg daily by her pcp. She had initial appointment today with Dr. Harl Bowie in [place] was sent to ED for further evaluation. She was subsequently transferred to [facility] for further management.  Principal Problem:   Unstable angina No MI thus far but is still having 5/10 CP.   Recent positive nuc at [facility].(moderate anterior and apical defect with moderate reversibility, LVEF 61%.).  Left heart cath today.  NPO now.  I would favor adding IV NTG if her BP would tolerate it but currently she is low.   On IV heparin.  No acute EKG changes othe than left axis deviation.   Active Problems:   HTN  (hypertension)  Hypotensive this morning.  Has not has Toprol 50 yet this morning.  Will decrease to 25mg  with parameters.     HLD    Lipitor 80mg .      Obesity  Discussed weight loss,  Carb restriction   GERD (gastroesophageal reflux disease)   Hypothyroid  On levothyroxine    LOS: 1 day    HAGER, BRYAN PA-C 06/14/2013 10:39 AM   Patient seen and examined. Agree with assessment and plan. Very pleasant 68 yo WF with a several week history of exertional chest pressure and recent scintigraphic evidence of anterior and apical defect on nuclear imaging. She presents with unstable angina and has residual discomfort presently. Initial troponins are negative and ECG's are without changes. She is currently on iv heparin; BP in mid 90's. Will hydrate with NS and if BP allows start NTG. Discussed cath and possible PCI with pt and husband. 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 She also underwent thorocotomy at age 49. I have added pt to cath schedule for today.   Troy Sine, MD, [facility] 06/14/2013 11:19 AM

## 2013-06-14 NOTE — Interval H&P Note (Signed)
Cath Lab Visit (complete for each Cath Lab visit)  Clinical Evaluation Leading to the Procedure:   ACS: yes  Non-ACS:    Anginal Classification: CCS IV  Anti-ischemic medical therapy: Maximal Therapy (2 or more classes of medications)  Non-Invasive Test Results: Intermediate-risk stress test findings: cardiac mortality 1-3%/year  Prior CABG: No previous CABG      History and Physical Interval Note:  06/14/2013 4:12 PM  Jamie Hurst  has presented today for surgery, with the diagnosis of cp  The various methods of treatment have been discussed with the patient and family. After consideration of risks, benefits and other options for treatment, the patient has consented to  Procedure(s): LEFT HEART CATHETERIZATION WITH CORONARY ANGIOGRAM (N/A) as a surgical intervention .  The patient's history has been reviewed, patient examined, no change in status, stable for surgery.  I have reviewed the patient's chart and labs.  Questions were answered to the patient's satisfaction.     Jette Lewan S.

## 2013-06-15 DIAGNOSIS — I200 Unstable angina: Secondary | ICD-10-CM

## 2013-06-15 LAB — CBC
HCT: 37.2 % (ref 36.0–46.0)
HEMOGLOBIN: 11.7 g/dL — AB (ref 12.0–15.0)
MCH: 28.7 pg (ref 26.0–34.0)
MCHC: 31.5 g/dL (ref 30.0–36.0)
MCV: 91.2 fL (ref 78.0–100.0)
Platelets: 147 10*3/uL — ABNORMAL LOW (ref 150–400)
RBC: 4.08 MIL/uL (ref 3.87–5.11)
RDW: 13.5 % (ref 11.5–15.5)
WBC: 3.5 10*3/uL — ABNORMAL LOW (ref 4.0–10.5)

## 2013-06-15 LAB — HEPARIN LEVEL (UNFRACTIONATED)

## 2013-06-15 MED ORDER — ASPIRIN 81 MG PO TBEC: 81.0000 mg | DELAYED_RELEASE_TABLET | Freq: Every day | ORAL | Status: AC

## 2013-06-15 MED ORDER — AMLODIPINE BESYLATE 2.5 MG PO TABS: 2.5000 mg | ORAL_TABLET | Freq: Every day | ORAL | Status: DC

## 2013-06-15 MED ORDER — METOPROLOL SUCCINATE ER 25 MG PO TB24: 25.0000 mg | ORAL_TABLET | Freq: Every day | ORAL | Status: DC

## 2013-06-15 NOTE — Discharge Summary (Signed)
Physician Discharge Summary     Patient ID: Jamie Hurst MRN: [id number] DOB/AGE: 11-Mar-1945 68 y.o.  Admit date: 06/13/2013 Discharge date: 06/15/2013  Admission Diagnoses:  Unstable angina  Discharge Diagnoses:  Principal Problem:   Unstable angina Active Problems:   HTN (hypertension)   GERD (gastroesophageal reflux disease)   Hypothyroid   HLD (hyperlipidemia)   Obesity, Class II, BMI 35-39.9   Discharged Condition: stable  Hospital Course:   Jamie Hurst is a 68 y.o. female who presents for evaluation of chest pain. She has hx of HTN, GERD, hypothyroidism, and obesity. She recently saw her PCP 2 wks prior for chest pain. She has been having episodes of chest pain that feel like heaviness across chest. She has had dyspnea on exertion which is new. She had severe episode while walking in mall, felt like 8/10 pain, associated with diaphoresis. Symptoms have been becoming more frequent. She had been referred for Lexiscan MPI 06/2013 which showed moderate anterior and apical defect with moderate reversibility, LVEF 61%. She was started on metoporlol, started ASA 81 mg daily by her pcp. She had initial appointment today with Dr. Harl Bowie in [place] was sent to ED for further evaluation. She was subsequently transferred to [facility] for further management.  Previous diagnostic testing for coronary artery disease includes: stress thallium.   Previous history of cardiac disease includes Angina. Coronary artery disease risk factors include: advanced age (older than 80 for men, 54 for women), hypertension and obesity (BMI >= 30 kg/m2). Patient denies history of CHF, coronary angioplasty, coronary artery stent, previous M.I. and valvular disease.  She was admitted with unstable angina and started on IV hepain.  NTG was used as needed however, the patient was hypotensive and limited the use of nitrates.  Her Toprol was reduced to 25mg .  She was started on a ASA and statin.  She underwent a left heart  cath which revealed normal coronary arteries with catheter induced vasospam.  2.5mg  of amlodipine was started.  We had a long discussion regarding lifestyle changes and weight loss which the patient appears eager to initiate.  We will discontinue the statin and recheck lipids in 3-6 months with the expectation that they will be improved.  The patient was seen by Dr. Romeo Rabon who felt she was stable for DC home.  Follow up with Dr. Harl Bowie.    Consults: None  Significant Diagnostic Studies:  Coronary angiography CONTRAST: Total of 120 cc.  COMPLICATIONS: None.  HEMODYNAMICS: Aortic pressure was 116/66; LV pressure was 118/2; LVEDP 10. There was no gradient between the left ventricle and aorta.  ANGIOGRAPHIC DATA: The left main coronary artery is absent.  The left anterior descending artery is a large vessel which reaches the apex. There is a large diagonal vessel which is widely patent. There was catheter-induced spasm in the proximal LAD. This improved with intracoronary nitroglycerin.  The left circumflex artery is a large vessel. There is a large first obtuse marginal which is widely patent. The circumflex system appears angiographically normal.  The right coronary artery is a large vessel. There is a large posterior descending artery. The posterior lateral artery is large as well. The RCA system appears angiographically normal.  LEFT VENTRICULOGRAM: Left ventricular angiogram was done in the 30 RAO projection and revealed normal left ventricular wall motion and systolic function with an estimated ejection fraction of 60 %. LVEDP was 10 mmHg.  ASCENDING AORTOGRAM: There is no evidence of aneurysm. The innominate artery fills and contrast is seen in the carotid.  There appears to be some late filling of contrast in the proximal subclavian.  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 2. Widely patent left anterior descending  artery and its branches. Catheter-induced spasm noted in the proximal LAD which improved with IC nitroglycerin. 3. Normal left circumflex artery and its branches. 4. Normal right coronary artery. 5. Normal left ventricular systolic function. LVEDP 10 mmHg. Ejection fraction 60%. 6. Occluded native right subclavian artery. Of note, she had a vascular surgery procedure when she was a teenager where she states a vessel had to be clipped. 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  RECOMMENDATION: Continue aggressive medical therapy and risk factor modification. I would put her on low-dose amlodipine to help with potential vasospasm. She will be watched overnight. Followup with Dr. Harl Bowie.    Lipid Panel     Component Value Date/Time   CHOL 236* 06/14/2013 0323   TRIG 168* 06/14/2013 0323   HDL 68 06/14/2013 0323   CHOLHDL 3.5 06/14/2013 0323   VLDL 34 06/14/2013 0323   LDLCALC 134* 06/14/2013 0323     Treatments: See above  Discharge Exam: Blood pressure 123/64, pulse 68, temperature 97.9 F (36.6 C), temperature source Oral, resp. rate 20, height 5\' 6"  (1.676 m), weight 222 lb 10.6 oz (101 kg), SpO2 95.00%.   Disposition: Final discharge disposition not confirmed      Discharge Instructions   Diet - low sodium heart healthy    Complete by:  As directed      Discharge instructions    Complete by:  As directed   No lifting with right arm for a few days.     Increase activity slowly    Complete by:  As directed             Medication List         acetaminophen 500 MG tablet  Commonly known as:  TYLENOL  Take 1,000 mg by mouth every 6 (six) hours as needed for headache.     amLODipine 2.5 MG tablet  Commonly known as:  NORVASC  Take 1 tablet (2.5 mg total) by mouth daily.     aspirin 81 MG EC tablet  Take 1 tablet (81 mg total) by mouth daily.     diphenoxylate-atropine 2.5-0.025 MG per tablet  Commonly known as:  LOMOTIL  Take 2 tablets by mouth 3 (three)  times daily as needed for diarrhea or loose stools.     levETIRAcetam 500 MG tablet  Commonly known as:  KEPPRA  Take 1 tablet by mouth 2 (two) times daily.     levothyroxine 100 MCG tablet  Commonly known as:  SYNTHROID, LEVOTHROID  Take 1 tablet by mouth daily.     methocarbamol 500 MG tablet  Commonly known as:  ROBAXIN  Take 1 tablet by mouth every 8 (eight) hours as needed for muscle spasms.     metoprolol succinate 25 MG 24 hr tablet  Commonly known as:  TOPROL-XL  Take 1 tablet (25 mg total) by mouth daily.     PROZAC 40 MG capsule  Generic drug:  FLUoxetine  Take 40 mg by mouth daily.     zolpidem 10 MG tablet  Commonly known as:  AMBIEN  Take 10 mg by mouth daily.       Follow-up Information   Follow up with Arnoldo Lenis, MD On 06/27/2013. (11:40 AM)  Specialty:  Cardiology   Contact information:   [address] [place] [address] [phone]       Greater than 30 minutes was spent completing the patient's discharge.   SignedTarri Fuller, PA-C 06/15/2013, 8:06 AM   I have examined the patient and reviewed assessment and plan and discussed with patient.  Agree with above as stated.  Stressed the importance of lifestyle modifications. 2+ right posterior tibial pulse. No right groin hematoma. 2+ right radial pulse. Stressed to the patient that she should not have a radial cath in the future.  Francisco Ostrovsky S.

## 2013-06-15 NOTE — Care Management Note (Addendum)
  Page 1 of 1   06/15/2013     10:27:26 AM CARE MANAGEMENT NOTE 06/15/2013  Patient:  Jamie Hurst, Jamie Hurst   Account Number:  [id number]  Date Initiated:  06/14/2013  Documentation initiated by:  MAYO,HENRIETTA  Subjective/Objective Assessment:   dx chest pain; lives with spouse    PCP   Monico Blitz     Action/Plan:   Anticipated DC Date:     Anticipated DC Plan:        [place]  CM consult      Choice offered to / List presented to:             Status of service:  Completed, signed off Medicare Important Message given?   (If response is "NO", the following Medicare IM given date fields will be blank) Date Medicare IM given:   Date Additional Medicare IM given:    Discharge Disposition:  HOME/SELF CARE  Per UR Regulation:  Reviewed for med. necessity/level of care/duration of stay  If discussed at [place] of Stay Meetings, dates discussed:    Comments:  Contact:  Baer,David Spouse [phone]

## 2013-06-15 NOTE — Progress Notes (Signed)
Subjective: No SOB or CP.  Objective: Vital signs in last 24 hours: Temp:  [97.6 F (36.4 C)-97.8 F (36.6 C)] 97.7 F (36.5 C) (06/10 2354) Pulse Rate:  [59-73] 65 (06/10 2354) Resp:  [14-20] 20 (06/10 2354) BP: (92-110)/(45-81) 105/65 mmHg (06/10 2354) SpO2:  [93 %-99 %] 95 % (06/10 2354) Last BM Date: 06/13/13  Intake/Output from previous day: 06/10 0701 - 06/11 0700 In: 1220.9 [P.O.:480; I.V.:740.9] Out: 1190 [Urine:1190] Intake/Output this shift: Total I/O In: 416.2 [P.O.:240; I.V.:176.2] Out: 790 [Urine:790]  Medications Current Facility-Administered Medications  Medication Dose Route Frequency Provider Last Rate Last Dose  . acetaminophen (TYLENOL) tablet 1,000 mg  1,000 mg Oral Q6H PRN Jettie Booze, MD      . amLODipine (NORVASC) tablet 2.5 mg  2.5 mg Oral Daily Jettie Booze, MD   2.5 mg at 06/14/13 1830  . aspirin chewable tablet 81 mg  81 mg Oral Daily Jettie Booze, MD      . aspirin EC tablet 81 mg  81 mg Oral Daily Alwyn Pea, MD   81 mg at 06/14/13 1546  . atorvastatin (LIPITOR) tablet 80 mg  80 mg Oral q1800 Alwyn Pea, MD   80 mg at 06/14/13 0014  . FLUoxetine (PROZAC) capsule 40 mg  40 mg Oral Daily Alwyn Pea, MD   40 mg at 06/14/13 1141  . levETIRAcetam (KEPPRA) tablet 500 mg  500 mg Oral BID Alwyn Pea, MD   500 mg at 06/14/13 2031  . levothyroxine (SYNTHROID, LEVOTHROID) tablet 100 mcg  100 mcg Oral QAC breakfast Alwyn Pea, MD   100 mcg at 06/15/13 0616  . metoprolol succinate (TOPROL-XL) 24 hr tablet 25 mg  25 mg Oral Daily Tarri Fuller, PA-C      . nitroGLYCERIN (NITROSTAT) SL tablet 0.4 mg  0.4 mg Sublingual Q5 Min x 3 PRN Alwyn Pea, MD      . zolpidem (AMBIEN) tablet 5 mg  5 mg Oral QHS PRN Alwyn Pea, MD   5 mg at 06/14/13 0014    PE: General appearance: alert, cooperative and no distress Lungs: clear to auscultation bilaterally Heart: regular rate and rhythm, S1, S2  normal, no murmur, click, rub or gallop Extremities: No LEE Pulses: 2+ and symmetric Skin: Right wirst:  No hematoma or ecchymosis Neurologic: Grossly normal  Lab Results:   Recent Labs  06/14/13 0323 06/15/13 0315  WBC 3.2* 3.5*  HGB 11.9* 11.7*  HCT 38.0 37.2  PLT 154 147*   BMET  Recent Labs  06/14/13 0323  NA 143  K 4.2  CL 107  CO2 23  GLUCOSE 94  BUN 12  CREATININE 0.73  CALCIUM 9.1   PT/INR  Recent Labs  06/14/13 0323  LABPROT 13.4  INR 1.04   Cholesterol  Recent Labs  06/14/13 0323  CHOL 236*   Lipid Panel     Component Value Date/Time   CHOL 236* 06/14/2013 0323   TRIG 168* 06/14/2013 0323   HDL 68 06/14/2013 0323   CHOLHDL 3.5 06/14/2013 0323   VLDL 34 06/14/2013 0323   LDLCALC 134* 06/14/2013 0323    Assessment/Plan  Principal Problem:   Unstable angina Active Problems:   HTN (hypertension)   GERD (gastroesophageal reflux disease)   Hypothyroid   HLD (hyperlipidemia)   Obesity, Class II, BMI 35-39.9  Plan:   SP LHC revealing no CAD, normal EF but with catheter induced spasm.  Occluded right subclavian.  Amlodipine 2.5 added. BP and HR stable.  Continue toprol XL 25mg .  We had a long discussion about diet and lifestyle change.  She seems eager to make the changes.  Will DC lipitor with the expectation that there will be improvement in the next 3-6 months.  Recheck lipids in 6 months.      LOS: 2 days    HAGER, BRYAN PA-C 06/15/2013 6:53 AM   I have examined the patient and reviewed assessment and plan and discussed with patient.  Agree with above as stated.    Xue Low S.

## 2013-06-27 ENCOUNTER — Encounter: Payer: Medicare Other | Admitting: Cardiology

## 2013-07-04 ENCOUNTER — Encounter: Payer: Self-pay | Admitting: Cardiology

## 2013-07-04 ENCOUNTER — Ambulatory Visit (INDEPENDENT_AMBULATORY_CARE_PROVIDER_SITE_OTHER): Payer: Medicare Other | Admitting: Cardiology

## 2013-07-04 VITALS — BP 102/69 | HR 76 | Ht 66.0 in | Wt 221.0 lb

## 2013-07-04 DIAGNOSIS — R0789 Other chest pain: Secondary | ICD-10-CM

## 2013-07-04 DIAGNOSIS — I200 Unstable angina: Secondary | ICD-10-CM

## 2013-07-04 DIAGNOSIS — E785 Hyperlipidemia, unspecified: Secondary | ICD-10-CM

## 2013-07-04 NOTE — Progress Notes (Signed)
Clinical Summary Ms. Shedden is a 68 y.o.female seen today for follow up of the following medical problems.  1. Chest pain - last visit earlier in the month complained of chest pain.  - Symptoms started were ongoing for a month. Episode while in mall where she became very diaphoretic. Aching pain in midchest, 8/10. No other symptoms. Lasted just a few minutes and then resolved on its own.  - repeat episode last weekend, discomfort in chest while at rest. Not positional, not pleuritic.  - seen by her pcp Dr Manuella Ghazi initially, referred for [facility] MPI 06/2013 which showed moderate anterior and apical defect with moderate reversibility, LVEF 61%. Started on metoporlol, started ASA 81 mg daily by her pcp and referred to [place].   - last visit she was having pain in clinic, referred to [facility] ER initially with eventual transfer to [facility] - cath 06/14/13 showed patent coronaries, there was catheter induced spasm in the LAD. LVEF 60%. Treated medically for possible spasm induced chest pain. Denies significant symptoms since discharge.    2. Hypothyroid - synthroid recently increased  3. Hyperlipidemia - muscle aches on lipitor, she has stopped.  Past Medical History  Diagnosis Date  . CAD (coronary artery disease)   . GERD (gastroesophageal reflux disease)   . Hypothyroidism   . Non Hodgkin's lymphoma   . IBS (irritable bowel syndrome)   . 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 Unspecified essential hypertension   . Allergic rhinitis   . HTN (hypertension) 06/13/2013     No Known Allergies   Current Outpatient Prescriptions  Medication Sig Dispense Refill  . acetaminophen (TYLENOL) 500 MG tablet Take 1,000 mg by mouth every 6 (six) hours as needed for headache.      Marland Kitchen amLODipine (NORVASC) 2.5 MG tablet Take 1 tablet (2.5 mg total) by mouth daily.  30 tablet  5  . aspirin EC 81 MG EC tablet Take 1 tablet (81 mg total) by mouth daily.      . diphenoxylate-atropine (LOMOTIL)  2.5-0.025 MG per tablet Take 2 tablets by mouth 3 (three) times daily as needed for diarrhea or loose stools.       Marland Kitchen FLUoxetine (PROZAC) 40 MG capsule Take 40 mg by mouth daily.      Marland Kitchen levETIRAcetam (KEPPRA) 500 MG tablet Take 1 tablet by mouth 2 (two) times daily.      Marland Kitchen levothyroxine (SYNTHROID, LEVOTHROID) 100 MCG tablet Take 1 tablet by mouth daily.      . methocarbamol (ROBAXIN) 500 MG tablet Take 1 tablet by mouth every 8 (eight) hours as needed for muscle spasms.       . metoprolol succinate (TOPROL-XL) 25 MG 24 hr tablet Take 1 tablet (25 mg total) by mouth daily.  30 tablet  5  . zolpidem (AMBIEN) 10 MG tablet Take 10 mg by mouth daily.        No current facility-administered medications for this visit.     Past Surgical History  Procedure Laterality Date  . Thoracotomy  1968    age 3  . Abdominal hysterectomy  1977  . Laminectomy  2013    x's 2  1st 2003 and 2nd 2013     No Known Allergies    No family history on file.   Social History Ms. Solem reports that she has never smoked. She has never used smokeless tobacco. Ms. Willhoite has no alcohol history on file.   Review of Systems CONSTITUTIONAL:  fatigue, + weight gain HEENT: Eyes: No visual loss, blurred vision, double vision or yellow sclerae.No hearing loss, sneezing, congestion, runny nose or sore throat.  SKIN: No rash or itching.  CARDIOVASCULAR: per HPI RESPIRATORY: No shortness of breath, cough or sputum.  GASTROINTESTINAL: No anorexia, nausea, vomiting or diarrhea. No abdominal pain or blood.  GENITOURINARY: No burning on urination, no polyuria NEUROLOGICAL: No headache, dizziness, syncope, paralysis, ataxia, numbness or tingling in the extremities. No change in bowel or bladder control.  MUSCULOSKELETAL: No muscle, back pain, joint pain or stiffness.  LYMPHATICS: No enlarged nodes. No history of splenectomy.  PSYCHIATRIC: No history of depression or anxiety.  ENDOCRINOLOGIC: No reports of  sweating, cold or heat intolerance. No polyuria or polydipsia.  Marland Kitchen   Physical Examination p 76 bp 102/69 Wt 221 lbs BMI 36 Gen: resting comfortably, no acute distress HEENT: no scleral icterus, pupils equal round and reactive, no palptable cervical adenopathy,  CV: RRR, no m/r/g, no JVD, no carotid bruits Resp: Clear to auscultation bilaterally GI: abdomen is soft, non-tender, non-distended, normal bowel sounds, no hepatosplenomegaly MSK: extremities are warm, no edema.  Skin: warm, no rash Neuro:  no focal deficits Psych: appropriate affect   Diagnostic Studies 06/14/13 Cath ANGIOGRAPHIC DATA: The left main coronary artery is absent.  The left anterior descending artery is a large vessel which reaches the apex. There is a large diagonal vessel which is widely patent. There was catheter-induced spasm in the proximal LAD. This improved with intracoronary nitroglycerin.  The left circumflex artery is a large vessel. There is a large first obtuse marginal which is widely patent. The circumflex system appears angiographically normal.  The right coronary artery is a large vessel. There is a large posterior descending artery. The posterior lateral artery is large as well. The RCA system appears angiographically normal.  LEFT VENTRICULOGRAM: Left ventricular angiogram was done in the 30 RAO projection and revealed normal left ventricular wall motion and systolic function with an estimated ejection fraction of 60 %. LVEDP was 10 mmHg.  ASCENDING AORTOGRAM: There is no evidence of aneurysm. The innominate artery fills and contrast is seen in the carotid. There appears to be some late filling of contrast in the proximal subclavian.  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 2. Widely patent left anterior descending artery and its branches. Catheter-induced spasm noted in the proximal LAD which improved with IC  nitroglycerin. 3. Normal left circumflex artery and its branches. 4. Normal right coronary artery. 5. Normal left ventricular systolic function. LVEDP 10 mmHg. Ejection fraction 60%. 6. Occluded native right subclavian artery. Of note, she had a vascular surgery procedure when she was a teenager where she states a vessel had to be clipped. 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  RECOMMENDATION: Continue aggressive medical therapy and risk factor modification. I would put her on low-dose amlodipine to help with potential vasospasm. She will be watched overnight. Followup with Dr. Harl Bowie.     Assessment and Plan  1. Chest pain - cath with patent coronaries, cathter induced spasm. Unclear if potentially vasospasm could be the cause of her symptoms. She is treated medically w/ metoprolol and amlodopine, symptoms improved - continue current medical therapy  2. Hyperlipidemia - reports muscle aches on lipitor, she has stopped. Reports multiple symptoms of fatigue, weight gain, etc attributed to her thyroid. Will hold off until resolved prior to trying alternative statin such as  pravastatin so as not to cloud her clinical picture with potential medication side effects.     F/u 6 months  Arnoldo Lenis, M.D., F.A.C.C.

## 2013-07-04 NOTE — Patient Instructions (Signed)
Continue all current medications. Your physician wants you to follow up in: 6 months.  You will receive a reminder letter in the mail one-two months in advance.  If you don't receive a letter, please call our office to schedule the follow up appointment   

## 2013-12-14 ENCOUNTER — Encounter (HOSPITAL_COMMUNITY): Payer: Self-pay | Admitting: Cardiology

## 2014-07-18 ENCOUNTER — Ambulatory Visit (INDEPENDENT_AMBULATORY_CARE_PROVIDER_SITE_OTHER): Payer: Medicare Other | Admitting: Cardiology

## 2014-07-18 ENCOUNTER — Other Ambulatory Visit (HOSPITAL_COMMUNITY)
Admission: RE | Admit: 2014-07-18 | Discharge: 2014-07-18 | Disposition: A | Discharge status: Home / Self Care | Payer: Medicare Other | Source: Ambulatory Visit | Attending: Cardiology | Admitting: Cardiology

## 2014-07-18 ENCOUNTER — Encounter: Payer: Self-pay | Admitting: Cardiology

## 2014-07-18 VITALS — BP 108/65 | HR 96 | Ht 66.0 in | Wt 217.0 lb

## 2014-07-18 DIAGNOSIS — R002 Palpitations: Secondary | ICD-10-CM | POA: Insufficient documentation

## 2014-07-18 DIAGNOSIS — R079 Chest pain, unspecified: Secondary | ICD-10-CM | POA: Diagnosis present

## 2014-07-18 LAB — TSH: TSH: 0.544 u[IU]/mL (ref 0.350–4.500)

## 2014-07-18 LAB — BASIC METABOLIC PANEL
ANION GAP: 13 (ref 5–15)
BUN: 15 mg/dL (ref 6–20)
CHLORIDE: 97 mmol/L — AB (ref 101–111)
CO2: 29 mmol/L (ref 22–32)
CREATININE: 0.92 mg/dL (ref 0.44–1.00)
Calcium: 9.6 mg/dL (ref 8.9–10.3)
GFR calc Af Amer: >60 mL/min (ref >60)
GFR calc non Af Amer: >60 mL/min (ref >60)
GLUCOSE: 100 mg/dL — AB (ref 65–99)
Potassium: 3.4 mmol/L — ABNORMAL LOW (ref 3.5–5.1)
SODIUM: 139 mmol/L (ref 135–145)

## 2014-07-18 LAB — MAGNESIUM: MAGNESIUM: 2 mg/dL (ref 1.7–2.4)

## 2014-07-18 MED ORDER — METOPROLOL SUCCINATE ER 25 MG PO TB24: 25.0000 mg | ORAL_TABLET | Freq: Every day | ORAL | Status: DC

## 2014-07-18 NOTE — Progress Notes (Signed)
Patient ID: Jamie Hurst, female   DOB: December 03, 1945, 69 y.o.   MRN: [id number]     Clinical Summary Ms. Jamie Hurst is a 69 y.o.female seen today for follow up of the following medical problems.   1. Chest pain  - last visit she was having pain in clinic, referred to [facility] ER initially with eventual transfer to [facility] - cath 06/14/13 showed patent coronaries, there was catheter induced spasm in the LAD. LVEF 60%. Treated medically for possible spasm induced chest pain.   2. Palpitations - recent episodes of palpitations/chest pain. Similar to pain last year. Left sided, dull pain. Notes some fluttering. - symptoms can last several hours. - coffee x 3-5 cups, limited, caffeine free sodas, no energy drinks, no EtOH.  - goodies taking 2 pills 3 times a day   Past Medical History  Diagnosis Date  . CAD (coronary artery disease)   . GERD (gastroesophageal reflux disease)   . Hypothyroidism   . Non Hodgkin's lymphoma   . IBS (irritable bowel syndrome)   . Aneurysm of aortic arch 2003    6mm  . Unspecified essential hypertension   . Allergic rhinitis   . HTN (hypertension) 06/13/2013     No Known Allergies   Current Outpatient Prescriptions  Medication Sig Dispense Refill  . acetaminophen (TYLENOL) 500 MG tablet Take 1,000 mg by mouth every 6 (six) hours as needed for headache.    Marland Kitchen amLODipine (NORVASC) 2.5 MG tablet Take 1 tablet (2.5 mg total) by mouth daily. 30 tablet 5  . aspirin EC 81 MG EC tablet Take 1 tablet (81 mg total) by mouth daily.    . diphenoxylate-atropine (LOMOTIL) 2.5-0.025 MG per tablet Take 2 tablets by mouth 3 (three) times daily as needed for diarrhea or loose stools.     Marland Kitchen FLUoxetine (PROZAC) 40 MG capsule Take 40 mg by mouth daily.    Marland Kitchen levETIRAcetam (KEPPRA) 500 MG tablet Take 1 tablet by mouth 2 (two) times daily.    Marland Kitchen levothyroxine (SYNTHROID, LEVOTHROID) 100 MCG tablet Take 1 tablet by mouth daily.    . methocarbamol (ROBAXIN) 500 MG tablet Take 1  tablet by mouth every 8 (eight) hours as needed for muscle spasms.     . metoprolol succinate (TOPROL-XL) 25 MG 24 hr tablet Take 1 tablet (25 mg total) by mouth daily. 30 tablet 5  . zolpidem (AMBIEN) 10 MG tablet Take 10 mg by mouth daily.      No current facility-administered medications for this visit.     Past Surgical History  Procedure Laterality Date  . Thoracotomy  1968    age 82  . Abdominal hysterectomy  1977  . Laminectomy  2013    x's 2  1st 2003 and 2nd 2013  . Left heart catheterization with coronary angiogram N/A 06/14/2013    Procedure: LEFT HEART CATHETERIZATION WITH CORONARY ANGIOGRAM;  Surgeon: Jamie M Martinique, MD;  Location: [facility] CATH LAB;  Service: Cardiovascular;  Laterality: N/A;     No Known Allergies    No family history on file.   Social History Jamie Hurst reports that she has never smoked. She has never used smokeless tobacco. Jamie Hurst has no alcohol history on file.   Review of Systems CONSTITUTIONAL: No weight loss, fever, chills, weakness or fatigue.  HEENT: Eyes: No visual loss, blurred vision, double vision or yellow sclerae.No hearing loss, sneezing, congestion, runny nose or sore throat.  SKIN: No rash or itching.  CARDIOVASCULAR: per hpi RESPIRATORY: No  cough  or sputum.  GASTROINTESTINAL: No anorexia, nausea, vomiting or diarrhea. No abdominal pain or blood.  GENITOURINARY: No burning on urination, no polyuria NEUROLOGICAL: No headache, dizziness, syncope, paralysis, ataxia, numbness or tingling in the extremities. No change in bowel or bladder control.  MUSCULOSKELETAL: No muscle, back pain, joint pain or stiffness.  LYMPHATICS: No enlarged nodes. No history of splenectomy.  PSYCHIATRIC: No history of depression or anxiety.  ENDOCRINOLOGIC: No reports of sweating, cold or heat intolerance. No polyuria or polydipsia.  Marland Kitchen   Physical Examination Filed Vitals:   07/18/14 1319  BP: 108/65  Pulse: 96   Filed Vitals:    07/18/14 1319  Height: 5\' 6"  (1.676 m)  Weight: 217 lb (98.431 kg)    Gen: resting comfortably, no acute distress HEENT: no scleral icterus, pupils equal round and reactive, no palptable cervical adenopathy,  CV: RRR, no m/r/g, no JVD Resp: Clear to auscultation bilaterally GI: abdomen is soft, non-tender, non-distended, normal bowel sounds, no hepatosplenomegaly MSK: extremities are warm, no edema.  Skin: warm, no rash Neuro:  no focal deficits Psych: appropriate affect   Diagnostic Studies  06/14/13 Cath ANGIOGRAPHIC DATA: The left main coronary artery is absent.  The left anterior descending artery is a large vessel which reaches the apex. There is a large diagonal vessel which is widely patent. There was catheter-induced spasm in the proximal LAD. This improved with intracoronary nitroglycerin.  The left circumflex artery is a large vessel. There is a large first obtuse marginal which is widely patent. The circumflex system appears angiographically normal.  The right coronary artery is a large vessel. There is a large posterior descending artery. The posterior lateral artery is large as well. The RCA system appears angiographically normal.  LEFT VENTRICULOGRAM: Left ventricular angiogram was done in the 30 RAO projection and revealed normal left ventricular wall motion and systolic function with an estimated ejection fraction of 60 %. LVEDP was 10 mmHg.  ASCENDING AORTOGRAM: There is no evidence of aneurysm. The innominate artery fills and contrast is seen in the carotid. There appears to be some late filling of contrast in the proximal subclavian.  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 2. Widely patent left anterior descending artery and its branches. Catheter-induced spasm noted in the proximal LAD which improved with IC nitroglycerin. 3. Normal left circumflex artery and its branches. 4. Normal  right coronary artery. 5. Normal left ventricular systolic function. LVEDP 10 mmHg. Ejection fraction 60%. 6. Occluded native right subclavian artery. Of note, she had a vascular surgery procedure when she was a teenager where she states a vessel had to be clipped. 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  RECOMMENDATION: Continue aggressive medical therapy and risk factor modification. I would put her on low-dose amlodipine to help with potential vasospasm. She will be watched overnight. Followup with Dr. Harl Bowie.   Assessment and Plan   1. Chest pain/palpitations - recent cath with patent coronaries - symptoms most suggestive of symptomatic arrhythmias. Seemed to have reoccured with increasing intake of her goodies powder as well as high caffeine intake - will wean caffeine and follow symptoms, if persist plan for cardiac monitor. She also stopped her Toprol on her own recently, will restart -  Check TSH and BMET and Mg        Arnoldo Lenis, M.D.

## 2014-07-18 NOTE — Patient Instructions (Signed)
Your physician recommends that you schedule a follow-up appointment in: 3 weeks with Dr. Harl Bowie in [place].  Your physician has recommended you make the following change in your medication:   Start Toprol XL 25 mg Daily   Your physician recommends that you return for lab work in:   Thank you for choosing [facility]!

## 2014-07-19 ENCOUNTER — Telehealth: Payer: Self-pay

## 2014-07-19 NOTE — Telephone Encounter (Signed)
Spoke to PT and advised her of her lab results. She expressed understanding

## 2014-07-19 NOTE — Telephone Encounter (Signed)
-----   Message from Arnoldo Lenis, MD sent at 07/19/2014  1:21 PM EDT ----- Potassium is low, would take 20 Meq daily for the next 3 days.  Zandra Abts MD

## 2014-07-20 ENCOUNTER — Other Ambulatory Visit: Payer: Self-pay | Admitting: Cardiovascular Disease

## 2014-07-20 MED ORDER — POTASSIUM CHLORIDE CRYS ER 20 MEQ PO TBCR: 20.0000 meq | EXTENDED_RELEASE_TABLET | Freq: Every day | ORAL | Status: DC

## 2014-07-20 NOTE — Telephone Encounter (Signed)
Needs RX for Potassium / tg

## 2014-07-20 NOTE — Telephone Encounter (Signed)
E scribed potassium to pharmacy

## 2014-08-13 ENCOUNTER — Encounter: Payer: Self-pay | Admitting: Cardiology

## 2014-08-13 ENCOUNTER — Ambulatory Visit (INDEPENDENT_AMBULATORY_CARE_PROVIDER_SITE_OTHER): Payer: Medicare Other | Admitting: Cardiology

## 2014-08-13 VITALS — BP 128/82 | HR 88 | Ht 66.0 in | Wt 219.0 lb

## 2014-08-13 DIAGNOSIS — R0789 Other chest pain: Secondary | ICD-10-CM

## 2014-08-13 DIAGNOSIS — R002 Palpitations: Secondary | ICD-10-CM

## 2014-08-13 NOTE — Progress Notes (Signed)
Patient ID: Miyo Aina, female   DOB: [birth year], 69 y.o.   MRN: [id number]     Clinical Summary Ms. Leath is a 69 y.o.female seen today for follow up of the following medical problems.   1. Chest pain - cath 06/14/13 showed patent coronaries, there was catheter induced spasm in the LAD. LVEF 60%. Treated medically for possible spasm induced chest pain.  - denies any recent chest pain  2. Palpitations - last visit noted increasing episodes of palpitations. It was noted that she had a very high caffeine intake, drinking multiple cups of coffee a day and also with a recent increase in the amount of goodies powder she was taking.  - since cutting back on caffeine symptoms have resolved.   3. Hypokalemia - low K on last check at 3.4, was replaced orally.  Past Medical History  Diagnosis Date  . CAD (coronary artery disease)   . GERD (gastroesophageal reflux disease)   . Hypothyroidism   . Non Hodgkin's lymphoma   . IBS (irritable bowel syndrome)   . Aneurysm of aortic arch 2003    51mm  . Unspecified essential hypertension   . Allergic rhinitis   . HTN (hypertension) 06/13/2013     No Known Allergies   Current Outpatient Prescriptions  Medication Sig Dispense Refill  . acetaminophen (TYLENOL) 500 MG tablet Take 1,000 mg by mouth every 6 (six) hours as needed for headache.    Marland Kitchen amLODipine (NORVASC) 2.5 MG tablet Take 1 tablet (2.5 mg total) by mouth daily. 30 tablet 5  . aspirin EC 81 MG EC tablet Take 1 tablet (81 mg total) by mouth daily.    . diphenoxylate-atropine (LOMOTIL) 2.5-0.025 MG per tablet Take 2 tablets by mouth 3 (three) times daily as needed for diarrhea or loose stools.     Marland Kitchen FLUoxetine (PROZAC) 40 MG capsule Take 40 mg by mouth daily.    Marland Kitchen levETIRAcetam (KEPPRA) 500 MG tablet Take 1 tablet by mouth 2 (two) times daily.    Marland Kitchen levothyroxine (SYNTHROID, LEVOTHROID) 100 MCG tablet Take 1 tablet by mouth daily.    . methocarbamol (ROBAXIN) 500 MG tablet Take  1 tablet by mouth every 8 (eight) hours as needed for muscle spasms.     . metoprolol succinate (TOPROL XL) 25 MG 24 hr tablet Take 1 tablet (25 mg total) by mouth daily. 30 tablet 11  . potassium chloride SA (KLOR-CON M20) 20 MEQ tablet Take 1 tablet (20 mEq total) by mouth daily. 3 tablet 0  . zolpidem (AMBIEN) 10 MG tablet Take 10 mg by mouth daily.      No current facility-administered medications for this visit.     Past Surgical History  Procedure Laterality Date  . Thoracotomy  1968    age 69  . Abdominal hysterectomy  1977  . Laminectomy  2013    x's 2  1st 2003 and 2nd 2013  . Left heart catheterization with coronary angiogram N/A 06/14/2013    Procedure: LEFT HEART CATHETERIZATION WITH CORONARY ANGIOGRAM;  Surgeon: Peter M Martinique, MD;  Location: [facility] CATH LAB;  Service: Cardiovascular;  Laterality: N/A;     No Known Allergies    No family history on file.   Social History Ms. Croswell reports that she has never smoked. She has never used smokeless tobacco. Ms. Brummell has no alcohol history on file.   Review of Systems CONSTITUTIONAL: No weight loss, fever, chills, weakness or fatigue.  HEENT: Eyes: No visual loss, blurred vision, double vision  or yellow sclerae.No hearing loss, sneezing, congestion, runny nose or sore throat.  SKIN: No rash or itching.  CARDIOVASCULAR: per HPI RESPIRATORY: No shortness of breath, cough or sputum.  GASTROINTESTINAL: No anorexia, nausea, vomiting or diarrhea. No abdominal pain or blood.  GENITOURINARY: No burning on urination, no polyuria NEUROLOGICAL: No headache, dizziness, syncope, paralysis, ataxia, numbness or tingling in the extremities. No change in bowel or bladder control.  MUSCULOSKELETAL: No muscle, back pain, joint pain or stiffness.  LYMPHATICS: No enlarged nodes. No history of splenectomy.  PSYCHIATRIC: No history of depression or anxiety.  ENDOCRINOLOGIC: No reports of sweating, cold or heat intolerance. No  polyuria or polydipsia.  Marland Kitchen   Physical Examination Filed Vitals:   08/13/14 0910  BP: 128/82  Pulse: 88   Filed Vitals:   08/13/14 0910  Height: 5\' 6"  (1.676 m)  Weight: 219 lb (99.338 kg)    Gen: resting comfortably, no acute distress HEENT: no scleral icterus, pupils equal round and reactive, no palptable cervical adenopathy,  CV: RRR, no m/r/g, no JVD Resp: Clear to auscultation bilaterally GI: abdomen is soft, non-tender, non-distended, normal bowel sounds, no hepatosplenomegaly MSK: extremities are warm, no edema.  Skin: warm, no rash Neuro:  no focal deficits Psych: appropriate affect   Diagnostic Studies 06/14/13 Cath ANGIOGRAPHIC DATA: The left main coronary artery is absent.  The left anterior descending artery is a large vessel which reaches the apex. There is a large diagonal vessel which is widely patent. There was catheter-induced spasm in the proximal LAD. This improved with intracoronary nitroglycerin.  The left circumflex artery is a large vessel. There is a large first obtuse marginal which is widely patent. The circumflex system appears angiographically normal.  The right coronary artery is a large vessel. There is a large posterior descending artery. The posterior lateral artery is large as well. The RCA system appears angiographically normal.  LEFT VENTRICULOGRAM: Left ventricular angiogram was done in the 30 RAO projection and revealed normal left ventricular wall motion and systolic function with an estimated ejection fraction of 60 %. LVEDP was 10 mmHg.  ASCENDING AORTOGRAM: There is no evidence of aneurysm. The innominate artery fills and contrast is seen in the carotid. There appears to be some late filling of contrast in the proximal subclavian.  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 2. Widely patent left anterior descending artery and its branches. Catheter-induced  spasm noted in the proximal LAD which improved with IC nitroglycerin. 3. Normal left circumflex artery and its branches. 4. Normal right coronary artery. 5. Normal left ventricular systolic function. LVEDP 10 mmHg. Ejection fraction 60%. 6. Occluded native right subclavian artery. Of note, she had a vascular surgery procedure when she was a teenager where she states a vessel had to be clipped. 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  RECOMMENDATION: Continue aggressive medical therapy and risk factor modification. I would put her on low-dose amlodipine to help with potential vasospasm. She will be watched overnight. Followup with Dr. Harl Bowie.    Assessment and Plan 1. Chest pain - negative cath 06/2013, question of possible spasm induced chest pain. Continue amlodopine.  - no recent symptoms  2. Palpitations - symptoms resolved with cut back in caffeine intake and restarting her Toprol XL - continue current meds, if symptoms reoccur consider heat monitor   F/u 1 year      Arnoldo Lenis, M.D.

## 2014-08-13 NOTE — Patient Instructions (Signed)

## 2015-01-08 DIAGNOSIS — S52531A Colles' fracture of right radius, initial encounter for closed fracture: Secondary | ICD-10-CM | POA: Diagnosis not present

## 2015-01-08 DIAGNOSIS — G5601 Carpal tunnel syndrome, right upper limb: Secondary | ICD-10-CM | POA: Diagnosis not present

## 2015-01-15 DIAGNOSIS — H9212 Otorrhea, left ear: Secondary | ICD-10-CM | POA: Diagnosis not present

## 2015-01-15 DIAGNOSIS — H7202 Central perforation of tympanic membrane, left ear: Secondary | ICD-10-CM | POA: Diagnosis not present

## 2015-01-15 DIAGNOSIS — H9012 Conductive hearing loss, unilateral, left ear, with unrestricted hearing on the contralateral side: Secondary | ICD-10-CM | POA: Diagnosis not present

## 2015-01-21 DIAGNOSIS — E039 Hypothyroidism, unspecified: Secondary | ICD-10-CM | POA: Diagnosis not present

## 2015-01-21 DIAGNOSIS — H7013 Chronic mastoiditis, bilateral: Secondary | ICD-10-CM | POA: Diagnosis not present

## 2015-01-21 DIAGNOSIS — M25531 Pain in right wrist: Secondary | ICD-10-CM | POA: Diagnosis not present

## 2015-01-21 DIAGNOSIS — I10 Essential (primary) hypertension: Secondary | ICD-10-CM | POA: Diagnosis not present

## 2015-01-21 DIAGNOSIS — R208 Other disturbances of skin sensation: Secondary | ICD-10-CM | POA: Diagnosis not present

## 2015-01-31 DIAGNOSIS — H9012 Conductive hearing loss, unilateral, left ear, with unrestricted hearing on the contralateral side: Secondary | ICD-10-CM | POA: Diagnosis not present

## 2015-01-31 DIAGNOSIS — H903 Sensorineural hearing loss, bilateral: Secondary | ICD-10-CM | POA: Diagnosis not present

## 2015-01-31 DIAGNOSIS — H7202 Central perforation of tympanic membrane, left ear: Secondary | ICD-10-CM | POA: Diagnosis not present

## 2015-01-31 DIAGNOSIS — H9212 Otorrhea, left ear: Secondary | ICD-10-CM | POA: Diagnosis not present

## 2015-02-05 DIAGNOSIS — G5601 Carpal tunnel syndrome, right upper limb: Secondary | ICD-10-CM | POA: Diagnosis not present

## 2015-02-05 DIAGNOSIS — K219 Gastro-esophageal reflux disease without esophagitis: Secondary | ICD-10-CM | POA: Diagnosis not present

## 2015-02-05 DIAGNOSIS — S52531P Colles' fracture of right radius, subsequent encounter for closed fracture with malunion: Secondary | ICD-10-CM | POA: Diagnosis not present

## 2015-02-05 DIAGNOSIS — M779 Enthesopathy, unspecified: Secondary | ICD-10-CM | POA: Diagnosis not present

## 2015-02-05 DIAGNOSIS — R569 Unspecified convulsions: Secondary | ICD-10-CM | POA: Diagnosis not present

## 2015-02-05 DIAGNOSIS — Z79899 Other long term (current) drug therapy: Secondary | ICD-10-CM | POA: Diagnosis not present

## 2015-02-05 DIAGNOSIS — X58XXXD Exposure to other specified factors, subsequent encounter: Secondary | ICD-10-CM | POA: Diagnosis not present

## 2015-02-05 DIAGNOSIS — F418 Other specified anxiety disorders: Secondary | ICD-10-CM | POA: Diagnosis not present

## 2015-02-05 DIAGNOSIS — I10 Essential (primary) hypertension: Secondary | ICD-10-CM | POA: Diagnosis not present

## 2015-02-05 DIAGNOSIS — Z8249 Family history of ischemic heart disease and other diseases of the circulatory system: Secondary | ICD-10-CM | POA: Diagnosis not present

## 2015-02-05 DIAGNOSIS — E7800 Pure hypercholesterolemia, unspecified: Secondary | ICD-10-CM | POA: Diagnosis not present

## 2015-02-05 DIAGNOSIS — M778 Other enthesopathies, not elsewhere classified: Secondary | ICD-10-CM | POA: Diagnosis not present

## 2015-02-05 DIAGNOSIS — J449 Chronic obstructive pulmonary disease, unspecified: Secondary | ICD-10-CM | POA: Diagnosis not present

## 2015-02-05 DIAGNOSIS — Z8572 Personal history of non-Hodgkin lymphomas: Secondary | ICD-10-CM | POA: Diagnosis not present

## 2015-02-18 ENCOUNTER — Other Ambulatory Visit (HOSPITAL_COMMUNITY): Payer: Self-pay | Admitting: Otolaryngology

## 2015-02-18 DIAGNOSIS — H9212 Otorrhea, left ear: Secondary | ICD-10-CM

## 2015-02-18 DIAGNOSIS — H7202 Central perforation of tympanic membrane, left ear: Secondary | ICD-10-CM

## 2015-02-18 DIAGNOSIS — H902 Conductive hearing loss, unspecified: Secondary | ICD-10-CM

## 2015-02-19 ENCOUNTER — Ambulatory Visit (HOSPITAL_COMMUNITY): Payer: Medicare Other

## 2015-02-21 ENCOUNTER — Ambulatory Visit (HOSPITAL_COMMUNITY)
Admission: RE | Admit: 2015-02-21 | Discharge: 2015-02-21 | Disposition: A | Discharge status: Home / Self Care | Payer: Medicare Other | Source: Ambulatory Visit | Attending: Otolaryngology | Admitting: Otolaryngology

## 2015-02-21 DIAGNOSIS — H748X2 Other specified disorders of left middle ear and mastoid: Secondary | ICD-10-CM | POA: Insufficient documentation

## 2015-02-21 DIAGNOSIS — H7202 Central perforation of tympanic membrane, left ear: Secondary | ICD-10-CM | POA: Insufficient documentation

## 2015-02-21 DIAGNOSIS — H70002 Acute mastoiditis without complications, left ear: Secondary | ICD-10-CM | POA: Insufficient documentation

## 2015-02-21 DIAGNOSIS — H9212 Otorrhea, left ear: Secondary | ICD-10-CM

## 2015-02-21 DIAGNOSIS — H9012 Conductive hearing loss, unilateral, left ear, with unrestricted hearing on the contralateral side: Secondary | ICD-10-CM | POA: Diagnosis not present

## 2015-02-21 DIAGNOSIS — H902 Conductive hearing loss, unspecified: Secondary | ICD-10-CM

## 2015-02-25 DIAGNOSIS — H9212 Otorrhea, left ear: Secondary | ICD-10-CM | POA: Diagnosis not present

## 2015-02-25 DIAGNOSIS — H7202 Central perforation of tympanic membrane, left ear: Secondary | ICD-10-CM | POA: Diagnosis not present

## 2015-02-25 DIAGNOSIS — H902 Conductive hearing loss, unspecified: Secondary | ICD-10-CM | POA: Diagnosis not present

## 2015-02-25 DIAGNOSIS — H7292 Unspecified perforation of tympanic membrane, left ear: Secondary | ICD-10-CM | POA: Diagnosis not present

## 2015-03-29 DIAGNOSIS — G5601 Carpal tunnel syndrome, right upper limb: Secondary | ICD-10-CM | POA: Diagnosis not present

## 2015-03-29 DIAGNOSIS — S52501A Unspecified fracture of the lower end of right radius, initial encounter for closed fracture: Secondary | ICD-10-CM | POA: Diagnosis not present

## 2015-04-01 ENCOUNTER — Other Ambulatory Visit: Payer: Self-pay | Admitting: Orthopedic Surgery

## 2015-04-01 DIAGNOSIS — Z6836 Body mass index (BMI) 36.0-36.9, adult: Secondary | ICD-10-CM | POA: Diagnosis not present

## 2015-04-01 DIAGNOSIS — H6690 Otitis media, unspecified, unspecified ear: Secondary | ICD-10-CM | POA: Diagnosis not present

## 2015-04-01 DIAGNOSIS — G4700 Insomnia, unspecified: Secondary | ICD-10-CM | POA: Diagnosis not present

## 2015-04-01 DIAGNOSIS — Z789 Other specified health status: Secondary | ICD-10-CM | POA: Diagnosis not present

## 2015-04-08 DIAGNOSIS — J310 Chronic rhinitis: Secondary | ICD-10-CM | POA: Diagnosis not present

## 2015-04-08 DIAGNOSIS — H748X2 Other specified disorders of left middle ear and mastoid: Secondary | ICD-10-CM | POA: Diagnosis not present

## 2015-04-11 ENCOUNTER — Encounter (HOSPITAL_BASED_OUTPATIENT_CLINIC_OR_DEPARTMENT_OTHER): Payer: Self-pay | Admitting: *Deleted

## 2015-04-11 NOTE — Pre-Procedure Instructions (Signed)
[facility] Meds and cardiology notes reviewed by Dr Smith Robert. Ok for wrist surgery next week at [facility].

## 2015-04-18 ENCOUNTER — Ambulatory Visit (HOSPITAL_BASED_OUTPATIENT_CLINIC_OR_DEPARTMENT_OTHER)
Admission: RE | Admit: 2015-04-18 | Discharge: 2015-04-18 | Disposition: A | Discharge status: Home / Self Care | Payer: Medicare Other | Source: Ambulatory Visit | Attending: Orthopedic Surgery | Admitting: Orthopedic Surgery

## 2015-04-18 ENCOUNTER — Ambulatory Visit (HOSPITAL_BASED_OUTPATIENT_CLINIC_OR_DEPARTMENT_OTHER): Payer: Medicare Other | Admitting: Anesthesiology

## 2015-04-18 ENCOUNTER — Encounter (HOSPITAL_BASED_OUTPATIENT_CLINIC_OR_DEPARTMENT_OTHER): Payer: Self-pay | Admitting: *Deleted

## 2015-04-18 ENCOUNTER — Encounter (HOSPITAL_BASED_OUTPATIENT_CLINIC_OR_DEPARTMENT_OTHER)
Admission: RE | Disposition: A | Discharge status: Home / Self Care | Payer: Self-pay | Source: Ambulatory Visit | Attending: Orthopedic Surgery

## 2015-04-18 DIAGNOSIS — K219 Gastro-esophageal reflux disease without esophagitis: Secondary | ICD-10-CM | POA: Insufficient documentation

## 2015-04-18 DIAGNOSIS — E039 Hypothyroidism, unspecified: Secondary | ICD-10-CM | POA: Diagnosis not present

## 2015-04-18 DIAGNOSIS — S52501P Unspecified fracture of the lower end of right radius, subsequent encounter for closed fracture with malunion: Secondary | ICD-10-CM | POA: Diagnosis not present

## 2015-04-18 DIAGNOSIS — K589 Irritable bowel syndrome without diarrhea: Secondary | ICD-10-CM | POA: Insufficient documentation

## 2015-04-18 DIAGNOSIS — I2510 Atherosclerotic heart disease of native coronary artery without angina pectoris: Secondary | ICD-10-CM | POA: Diagnosis not present

## 2015-04-18 DIAGNOSIS — Z8572 Personal history of non-Hodgkin lymphomas: Secondary | ICD-10-CM | POA: Diagnosis not present

## 2015-04-18 DIAGNOSIS — Z7982 Long term (current) use of aspirin: Secondary | ICD-10-CM | POA: Diagnosis not present

## 2015-04-18 DIAGNOSIS — I10 Essential (primary) hypertension: Secondary | ICD-10-CM | POA: Diagnosis not present

## 2015-04-18 DIAGNOSIS — G8918 Other acute postprocedural pain: Secondary | ICD-10-CM | POA: Diagnosis not present

## 2015-04-18 DIAGNOSIS — S52501A Unspecified fracture of the lower end of right radius, initial encounter for closed fracture: Secondary | ICD-10-CM | POA: Diagnosis not present

## 2015-04-18 DIAGNOSIS — Z90710 Acquired absence of both cervix and uterus: Secondary | ICD-10-CM | POA: Insufficient documentation

## 2015-04-18 DIAGNOSIS — M79631 Pain in right forearm: Secondary | ICD-10-CM | POA: Diagnosis not present

## 2015-04-18 DIAGNOSIS — Z79899 Other long term (current) drug therapy: Secondary | ICD-10-CM | POA: Diagnosis not present

## 2015-04-18 HISTORY — DX: Unspecified convulsions: R56.9

## 2015-04-18 HISTORY — PX: OPEN REDUCTION INTERNAL FIXATION (ORIF) DISTAL RADIAL FRACTURE: SHX5989

## 2015-04-18 SURGERY — OPEN REDUCTION INTERNAL FIXATION (ORIF) DISTAL RADIUS FRACTURE
Anesthesia: General | Site: Arm Lower | Laterality: Right

## 2015-04-18 MED ORDER — ONDANSETRON HCL 4 MG/2ML IJ SOLN
INTRAMUSCULAR | Status: AC
  Filled 2015-04-18: qty 2

## 2015-04-18 MED ORDER — PROPOFOL 10 MG/ML IV BOLUS
INTRAVENOUS | Status: DC | PRN
  Administered 2015-04-18: 150 mg via INTRAVENOUS

## 2015-04-18 MED ORDER — DEXAMETHASONE SODIUM PHOSPHATE 4 MG/ML IJ SOLN
INTRAMUSCULAR | Status: DC | PRN
  Administered 2015-04-18: 10 mg via INTRAVENOUS

## 2015-04-18 MED ORDER — CEFAZOLIN SODIUM-DEXTROSE 2-4 GM/100ML-% IV SOLN
INTRAVENOUS | Status: AC
  Filled 2015-04-18: qty 100

## 2015-04-18 MED ORDER — LIDOCAINE HCL (CARDIAC) 20 MG/ML IV SOLN
INTRAVENOUS | Status: AC
  Filled 2015-04-18: qty 5

## 2015-04-18 MED ORDER — LACTATED RINGERS IV SOLN: INTRAVENOUS | Status: DC

## 2015-04-18 MED ORDER — LACTATED RINGERS IV SOLN
INTRAVENOUS | Status: DC
  Administered 2015-04-18 (×2): via INTRAVENOUS
  Administered 2015-04-18: 10 mL/h via INTRAVENOUS

## 2015-04-18 MED ORDER — OXYCODONE-ACETAMINOPHEN 5-325 MG PO TABS: ORAL_TABLET | ORAL | Status: DC

## 2015-04-18 MED ORDER — ONDANSETRON HCL 4 MG/2ML IJ SOLN
INTRAMUSCULAR | Status: DC | PRN
  Administered 2015-04-18: 4 mg via INTRAVENOUS

## 2015-04-18 MED ORDER — METOCLOPRAMIDE HCL 5 MG/ML IJ SOLN: 10.0000 mg | Freq: Once | INTRAMUSCULAR | Status: DC | PRN

## 2015-04-18 MED ORDER — EPHEDRINE SULFATE 50 MG/ML IJ SOLN
INTRAMUSCULAR | Status: DC | PRN
Start: 2015-04-18 — End: 2015-04-18
  Administered 2015-04-18: 10 mg via INTRAVENOUS

## 2015-04-18 MED ORDER — MEPERIDINE HCL 25 MG/ML IJ SOLN: 6.2500 mg | INTRAMUSCULAR | Status: DC | PRN

## 2015-04-18 MED ORDER — DEXAMETHASONE SODIUM PHOSPHATE 10 MG/ML IJ SOLN
INTRAMUSCULAR | Status: AC
  Filled 2015-04-18: qty 1

## 2015-04-18 MED ORDER — LIDOCAINE HCL (CARDIAC) 20 MG/ML IV SOLN
INTRAVENOUS | Status: DC | PRN
  Administered 2015-04-18: 100 mg via INTRAVENOUS

## 2015-04-18 MED ORDER — MIDAZOLAM HCL 2 MG/2ML IJ SOLN
INTRAMUSCULAR | Status: AC
  Filled 2015-04-18: qty 2

## 2015-04-18 MED ORDER — BUPIVACAINE-EPINEPHRINE (PF) 0.5% -1:200000 IJ SOLN
INTRAMUSCULAR | Status: DC | PRN
Start: 2015-04-18 — End: 2015-04-18
  Administered 2015-04-18: 20 mL via PERINEURAL

## 2015-04-18 MED ORDER — SCOPOLAMINE 1 MG/3DAYS TD PT72: 1.0000 | MEDICATED_PATCH | Freq: Once | TRANSDERMAL | Status: DC | PRN

## 2015-04-18 MED ORDER — MIDAZOLAM HCL 2 MG/2ML IJ SOLN
1.0000 mg | INTRAMUSCULAR | Status: DC | PRN
  Administered 2015-04-18: 2 mg via INTRAVENOUS

## 2015-04-18 MED ORDER — FENTANYL CITRATE (PF) 100 MCG/2ML IJ SOLN
INTRAMUSCULAR | Status: AC
  Filled 2015-04-18: qty 2

## 2015-04-18 MED ORDER — GLYCOPYRROLATE 0.2 MG/ML IJ SOLN: 0.2000 mg | Freq: Once | INTRAMUSCULAR | Status: DC | PRN

## 2015-04-18 MED ORDER — CEFAZOLIN SODIUM-DEXTROSE 2-4 GM/100ML-% IV SOLN
2.0000 g | INTRAVENOUS | Status: AC
  Administered 2015-04-18: 2 g via INTRAVENOUS

## 2015-04-18 MED ORDER — CHLORHEXIDINE GLUCONATE 4 % EX LIQD: 60.0000 mL | Freq: Once | CUTANEOUS | Status: DC

## 2015-04-18 MED ORDER — FENTANYL CITRATE (PF) 100 MCG/2ML IJ SOLN
25.0000 ug | INTRAMUSCULAR | Status: DC | PRN
  Administered 2015-04-18: 50 ug via INTRAVENOUS

## 2015-04-18 MED ORDER — FENTANYL CITRATE (PF) 100 MCG/2ML IJ SOLN
50.0000 ug | INTRAMUSCULAR | Status: DC | PRN
  Administered 2015-04-18: 100 ug via INTRAVENOUS

## 2015-04-18 SURGICAL SUPPLY — 67 items
BANDAGE ACE 3X5.8 VEL STRL LF (GAUZE/BANDAGES/DRESSINGS) ×3 IMPLANT
BIT DRILL 2.0 LNG QUCK RELEASE (BIT) ×1 IMPLANT
BIT DRILL 2.8X5 QR DISP (BIT) ×3 IMPLANT
BLADE SURG 15 STRL LF DISP TIS (BLADE) ×2 IMPLANT
BLADE SURG 15 STRL SS (BLADE) ×4
BNDG ESMARK 4X9 LF (GAUZE/BANDAGES/DRESSINGS) ×3 IMPLANT
BNDG GAUZE ELAST 4 BULKY (GAUZE/BANDAGES/DRESSINGS) ×3 IMPLANT
BNDG PLASTER X FAST 3X3 WHT LF (CAST SUPPLIES) IMPLANT
BONE CHIP PRESERV 5CC PCAN5 (Bone Implant) ×3 IMPLANT
CHLORAPREP W/TINT 26ML (MISCELLANEOUS) ×3 IMPLANT
CORDS BIPOLAR (ELECTRODE) ×3 IMPLANT
COVER BACK TABLE 60X90IN (DRAPES) ×3 IMPLANT
COVER MAYO STAND STRL (DRAPES) ×3 IMPLANT
DRAPE EXTREMITY T 121X128X90 (DRAPE) ×3 IMPLANT
DRAPE OEC MINIVIEW 54X84 (DRAPES) ×3 IMPLANT
DRAPE SURG 17X23 STRL (DRAPES) ×3 IMPLANT
DRILL 2.0 LNG QUICK RELEASE (BIT) ×3
GAUZE SPONGE 4X4 12PLY STRL (GAUZE/BANDAGES/DRESSINGS) ×3 IMPLANT
GAUZE XEROFORM 1X8 LF (GAUZE/BANDAGES/DRESSINGS) ×3 IMPLANT
GLOVE BIO SURGEON STRL SZ7.5 (GLOVE) ×3 IMPLANT
GLOVE BIOGEL PI IND STRL 7.0 (GLOVE) ×2 IMPLANT
GLOVE BIOGEL PI IND STRL 8 (GLOVE) ×1 IMPLANT
GLOVE BIOGEL PI IND STRL 8.5 (GLOVE) ×1 IMPLANT
GLOVE BIOGEL PI INDICATOR 7.0 (GLOVE) ×4
GLOVE BIOGEL PI INDICATOR 8 (GLOVE) ×2
GLOVE BIOGEL PI INDICATOR 8.5 (GLOVE) ×2
GLOVE ECLIPSE 6.5 STRL STRAW (GLOVE) ×3 IMPLANT
GLOVE SURG ORTHO 8.0 STRL STRW (GLOVE) ×3 IMPLANT
GOWN STRL REUS W/ TWL LRG LVL3 (GOWN DISPOSABLE) ×1 IMPLANT
GOWN STRL REUS W/TWL LRG LVL3 (GOWN DISPOSABLE) ×2
GOWN STRL REUS W/TWL XL LVL3 (GOWN DISPOSABLE) ×6 IMPLANT
GUIDEWIRE ORTHO 0.054X6 (WIRE) ×9 IMPLANT
NEEDLE HYPO 25X1 1.5 SAFETY (NEEDLE) IMPLANT
NS IRRIG 1000ML POUR BTL (IV SOLUTION) ×3 IMPLANT
PACK BASIN DAY SURGERY FS (CUSTOM PROCEDURE TRAY) ×3 IMPLANT
PAD CAST 3X4 CTTN HI CHSV (CAST SUPPLIES) ×1 IMPLANT
PADDING CAST ABS 4INX4YD NS (CAST SUPPLIES) ×2
PADDING CAST ABS COTTON 4X4 ST (CAST SUPPLIES) ×1 IMPLANT
PADDING CAST COTTON 3X4 STRL (CAST SUPPLIES) ×2
PLATE R NARROW PROC VDR (Plate) ×3 IMPLANT
SCREW ACTK 2 NL HEX 3.5.11 (Screw) ×3 IMPLANT
SCREW BN FT 16X2.3XLCK HEX CRT (Screw) ×3 IMPLANT
SCREW CORT FT 18X2.3XLCK HEX (Screw) ×2 IMPLANT
SCREW CORTICAL LOCKING 2.3X16M (Screw) ×8 IMPLANT
SCREW CORTICAL LOCKING 2.3X18M (Screw) ×6 IMPLANT
SCREW FX16X2.3XLCK SMTH NS CRT (Screw) ×1 IMPLANT
SCREW FX18X2.3XSMTH LCK NS CRT (Screw) ×1 IMPLANT
SCREW HEXALOBE NON-LOCK 3.5X16 (Screw) ×3 IMPLANT
SCREW NONLOCK HEX 3.5X12 (Screw) ×3 IMPLANT
SLEEVE SCD COMPRESS KNEE MED (MISCELLANEOUS) ×3 IMPLANT
STOCKINETTE 4X48 STRL (DRAPES) ×3 IMPLANT
SUCTION FRAZIER HANDLE 10FR (MISCELLANEOUS)
SUCTION TUBE FRAZIER 10FR DISP (MISCELLANEOUS) IMPLANT
SUT ETHILON 3 0 PS 1 (SUTURE) IMPLANT
SUT ETHILON 4 0 PS 2 18 (SUTURE) ×3 IMPLANT
SUT VIC AB 2-0 SH 27 (SUTURE)
SUT VIC AB 2-0 SH 27XBRD (SUTURE) IMPLANT
SUT VIC AB 3-0 PS1 18 (SUTURE)
SUT VIC AB 3-0 PS1 18XBRD (SUTURE) IMPLANT
SUT VICRYL 4-0 PS2 18IN ABS (SUTURE) ×3 IMPLANT
SYR BULB 3OZ (MISCELLANEOUS) ×3 IMPLANT
SYR CONTROL 10ML LL (SYRINGE) IMPLANT
TOWEL OR 17X24 6PK STRL BLUE (TOWEL DISPOSABLE) ×6 IMPLANT
TOWEL OR NON WOVEN STRL DISP B (DISPOSABLE) ×3 IMPLANT
TUBE CONNECTING 20'X1/4 (TUBING)
TUBE CONNECTING 20X1/4 (TUBING) IMPLANT
UNDERPAD 30X30 (UNDERPADS AND DIAPERS) ×3 IMPLANT

## 2015-04-18 NOTE — Discharge Instructions (Addendum)

## 2015-04-18 NOTE — Transfer of Care (Signed)
Immediate Anesthesia Transfer of Care Note  Patient: Jamie Hurst  Procedure(s) Performed: Procedure(s): OPEN REDUCTION INTERNAL FIXATION (ORIF) RIGHT  DISTAL RADIUS OSTEOTOMY (Right)  Patient Location: PACU  Anesthesia Type:GA combined with regional for post-op pain  Level of Consciousness: awake, sedated and patient cooperative  Airway & Oxygen Therapy: Patient Spontanous Breathing and Patient connected to face mask oxygen  Post-op Assessment: Report given to RN and Post -op Vital signs reviewed and stable  Post vital signs: Reviewed and stable  Last Vitals:  Filed Vitals:   04/18/15 1005 04/18/15 1009  BP: 113/56   Pulse: 81 83  Temp:    Resp: 12 19    Complications: No apparent anesthesia complications

## 2015-04-18 NOTE — H&P (Signed)
Jamie Hurst is an 70 y.o. female.   Chief Complaint: right distal radius malunion HPI: 70 yo lhd female states she injured right wrist 11/16/14 when she fell on right wrist.  Distal radius fracture managed non operatively.  Subsequent carpal tunnel release.  Referred to me for further care.  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  She wishes to undergo osteotomy and fixation of the distal radius.  Allergies: No Known Allergies  Past Medical History  Diagnosis Date  . CAD (coronary artery disease)   . GERD (gastroesophageal reflux disease)   . Hypothyroidism   . Non Hodgkin's lymphoma ([place])   . IBS (irritable bowel syndrome)   . 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 Unspecified essential hypertension   . Allergic rhinitis   . HTN (hypertension) 06/13/2013  . Seizures ([place])     stress related hx of 1 seizure in 2012    Past Surgical History  Procedure Laterality Date  . Thoracotomy  1968    age 39  . Abdominal hysterectomy  1977  . Laminectomy  2013    x's 2  1st 2003 and 2nd 2013  . Left heart catheterization with coronary angiogram N/A 06/14/2013    Procedure: LEFT HEART CATHETERIZATION WITH CORONARY ANGIOGRAM;  Surgeon: Peter M Martinique, MD;  Location: [facility] CATH LAB;  Service: Cardiovascular;  Laterality: N/A;  . Wrist capsulotomy      Family History: History reviewed. No pertinent family history.  Social History:   reports that she has never smoked. She has never used smokeless tobacco. She reports that she does not drink alcohol or use illicit drugs.  Medications: Medications Prior to Admission  Medication Sig Dispense Refill  . acetaminophen (TYLENOL) 500 MG tablet Take 1,000 mg by mouth every 6 (six) hours as needed for headache.    Marland Kitchen aspirin EC 81 MG EC tablet Take 1 tablet (81 mg total) by mouth daily.    . diphenoxylate-atropine (LOMOTIL) 2.5-0.025 MG per tablet Take 2 tablets by mouth 3 (three) times daily as needed  for diarrhea or loose stools.     Marland Kitchen FLUoxetine (PROZAC) 40 MG capsule Take 40 mg by mouth daily.    Marland Kitchen levETIRAcetam (KEPPRA) 500 MG tablet Take 1 tablet by mouth 2 (two) times daily.    Marland Kitchen levothyroxine (SYNTHROID, LEVOTHROID) 100 MCG tablet Take 1 tablet by mouth daily.    . metoprolol succinate (TOPROL XL) 25 MG 24 hr tablet Take 1 tablet (25 mg total) by mouth daily. 30 tablet 11  . topiramate (TOPAMAX) 50 MG tablet Take 1 tablet by mouth 2 (two) times daily as needed.    . zolpidem (AMBIEN) 10 MG tablet Take 10 mg by mouth daily.       No results found for this or any previous visit (from the past 48 hour(s)).  No results found.   A comprehensive review of systems was negative except for: Musculoskeletal: positive for arthralgias  Blood pressure 133/62, pulse 87, temperature 97.6 F (36.4 C), temperature source Oral, resp. rate 20, height 5\' 6"  (1.676 m), weight 104.781 kg (231 lb), SpO2 99 %.  General appearance: alert, cooperative and appears stated age Head: Normocephalic, without obvious abnormality, atraumatic Neck: supple, symmetrical, trachea midline Resp: clear to auscultation bilaterally Cardio: regular rate and rhythm GI: non-tender Extremities: decreased sensation thumb/index/long/ring and intact capillary refill all digits.  +epl/fpl/io.  No wounds. Pulses: 2+ and symmetric Skin: Skin color, texture, turgor normal. No  rashes or lesions Neurologic: Grossly normal Incision/Wound:none  Assessment/Plan Right distal radius malunion.  Plan osteotomy distal radius with ORIF.  Non operative and operative treatment options were discussed with the patient and patient wishes to proceed with operative treatment. Risks, benefits, and alternatives of surgery were discussed and the patient agrees with the plan of care.   Jamie Hurst R 04/18/2015, 9:40 AM

## 2015-04-18 NOTE — Brief Op Note (Signed)
04/18/2015  12:16 PM  PATIENT:  Kyla Balzarine  70 y.o. female  PRE-OPERATIVE DIAGNOSIS:  RIGHT DISTAL RADIUS MALUNION  POST-OPERATIVE DIAGNOSIS:  RIGHT DISTAL RADIUS MALUNION  PROCEDURE:  Procedure(s): OPEN REDUCTION INTERNAL FIXATION (ORIF) RIGHT  DISTAL RADIUS OSTEOTOMY (Right)  SURGEON:  Surgeon(s) and Role:    * Leanora Cover, MD - Primary    * Daryll Brod, MD - Assisting  PHYSICIAN ASSISTANT:   ASSISTANTS: Daryll Brod, MD   ANESTHESIA:   regional and general  EBL:  Total I/O In: 1000 [I.V.:1000] Out: -   BLOOD ADMINISTERED:none  DRAINS: none   LOCAL MEDICATIONS USED:  NONE  SPECIMEN:  No Specimen  DISPOSITION OF SPECIMEN:  N/A  COUNTS:  YES  TOURNIQUET:   Total Tourniquet Time Documented: Upper Arm (Right) - 82 minutes Total: Upper Arm (Right) - 82 minutes   DICTATION: .Other Dictation: Dictation Number [id number]  PLAN OF CARE: Discharge to home after PACU  PATIENT DISPOSITION:  PACU - hemodynamically stable.

## 2015-04-18 NOTE — Progress Notes (Signed)
Assisted Dr. Carignan with right, ultrasound guided, supraclavicular block. Side rails up, monitors on throughout procedure. See vital signs in flow sheet. Tolerated Procedure well. 

## 2015-04-18 NOTE — Anesthesia Postprocedure Evaluation (Signed)
Anesthesia Post Note  Patient: Jamie Hurst  Procedure(s) Performed: Procedure(s) (LRB): OPEN REDUCTION INTERNAL FIXATION (ORIF) RIGHT  DISTAL RADIUS OSTEOTOMY (Right)  Patient location during evaluation: PACU Anesthesia Type: General and Regional Level of consciousness: awake and alert Pain management: pain level controlled Vital Signs Assessment: post-procedure vital signs reviewed and stable Respiratory status: spontaneous breathing, nonlabored ventilation, respiratory function stable and patient connected to nasal cannula oxygen Cardiovascular status: blood pressure returned to baseline and stable Postop Assessment: no signs of nausea or vomiting Anesthetic complications: no    Last Vitals:  Filed Vitals:   04/18/15 1330 04/18/15 1345  BP: 116/77 107/72  Pulse: 77 76  Temp:    Resp: 17 14    Last Pain:  Filed Vitals:   04/18/15 1347  PainSc: 3                  Effie Berkshire

## 2015-04-18 NOTE — Anesthesia Procedure Notes (Signed)
Anesthesia Regional Block:  Supraclavicular block  Pre-Anesthetic Checklist: ,, timeout performed, Correct Patient, Correct Site, Correct Laterality, Correct Procedure, Correct Position, site marked, Risks and benefits discussed,  Surgical consent,  Pre-op evaluation,  At surgeon's request and post-op pain management  Laterality: Right and Upper  Prep: Maximum Sterile Barrier Precautions used and chloraprep       Needles:  Injection technique: Single-shot  Needle Type: Echogenic Stimulator Needle     Needle Length: 10cm 10 cm Needle Gauge: 21 and 21 G    Additional Needles:  Procedures: ultrasound guided (picture in chart) Supraclavicular block Narrative:  Injection made incrementally with aspirations every 5 mL.  Performed by: Personally   Additional Notes: Risks, benefits and alternative to block explained extensively.  Patient tolerated procedure well, without complications.

## 2015-04-18 NOTE — Anesthesia Preprocedure Evaluation (Addendum)
Anesthesia Evaluation  Patient identified by MRN, date of birth, ID band Patient awake    Reviewed: Allergy & Precautions, NPO status , Patient's Chart, lab work & pertinent test results  Airway Mallampati: II  TM Distance: >3 FB Neck ROM: Full    Dental no notable dental hx.    Pulmonary neg pulmonary ROS,    Pulmonary exam normal breath sounds clear to auscultation       Cardiovascular hypertension, Pt. on medications + angina + CAD  Normal cardiovascular exam Rhythm:Regular Rate:Normal  - cath 06/14/13 showed patent coronaries  37mm aortic arch aneurysm   Neuro/Psych Seizures -, Well Controlled,  negative psych ROS   GI/Hepatic negative GI ROS, Neg liver ROS,   Endo/Other  negative endocrine ROS  Renal/GU negative Renal ROS  negative genitourinary   Musculoskeletal negative musculoskeletal ROS (+)   Abdominal   Peds negative pediatric ROS (+)  Hematology negative hematology ROS (+)   Anesthesia Other Findings   Reproductive/Obstetrics negative OB ROS                           Anesthesia Physical Anesthesia Plan  ASA: II  Anesthesia Plan: General   Post-op Pain Management: GA combined w/ Regional for post-op pain   Induction: Intravenous  Airway Management Planned: LMA  Additional Equipment:   Intra-op Plan:   Post-operative Plan: Extubation in OR  Informed Consent: I have reviewed the patients History and Physical, chart, labs and discussed the procedure including the risks, benefits and alternatives for the proposed anesthesia with the patient or authorized representative who has indicated his/her understanding and acceptance.   Dental advisory given  Plan Discussed with: CRNA  Anesthesia Plan Comments: (SCB)        Anesthesia Quick Evaluation

## 2015-04-18 NOTE — Op Note (Signed)
I assisted Surgeon(s) and Role:    * Leanora Cover, MD - Primary    * Daryll Brod, MD - Assisting on the Procedure(s): OPEN REDUCTION INTERNAL FIXATION (ORIF) RIGHT  DISTAL RADIUS OSTEOTOMY on 04/18/2015.  I provided assistance on this case as follows: approach, retraction, osteotomy of the malunion, stabilization of the bone, fixation, removal of callus, bone graft, closure and application of splints. I was present for the entire case.  Electronically signed by: Wynonia Sours, MD Date: 04/18/2015 Time: 12:23 PM

## 2015-04-18 NOTE — Op Note (Signed)
Intra-operative fluoroscopic images in the AP, lateral, and oblique views were taken and evaluated by myself.  Reduction and hardware placement were confirmed.  There was no intraarticular penetration of permanent hardware.  

## 2015-04-18 NOTE — Op Note (Signed)
[id number] 

## 2015-04-19 NOTE — Op Note (Addendum)
NAME:  Jamie Hurst, Jamie [name]:  MEDICAL RECORD NO.:  [id number]  LOCATION:                                 FACILITY:  PHYSICIAN:  Leanora Cover, MD        DATE OF BIRTH:  [birth year]  DATE OF PROCEDURE:  04/18/2015 DATE OF DISCHARGE:                              OPERATIVE REPORT   PREOPERATIVE DIAGNOSIS:  Right distal radius malunion.  POSTOPERATIVE DIAGNOSIS:  Right distal radius malunion.  PROCEDURES:  Right distal radius osteotomy and open reduction and internal fixation.  SURGEON:  Leanora Cover, MD  ASSISTANT:  Daryll Brod, MD.  ANESTHESIA:  General with regional.  IV FLUIDS:  Per anesthesia flow sheet.  ESTIMATED BLOOD LOSS:  Minimal.  COMPLICATIONS:  None.  SPECIMENS:  None.  TOURNIQUET TIME:  82 minutes.  DISPOSITION:  Stable to PACU.  INDICATIONS:  Jamie Hurst is a 70 year old female who states she injured her right wrist at the end of last year in November when she fell.  She underwent nonoperative treatment.  She had a subsequent carpal tunnel release and then was referred to me for further evaluation with continued wrist pain.  Radiographs showed a distal radius malunion with shortening and dorsal angulation.  We discussed treatment options. She wished to proceed with osteotomy and fixation.  Risks, benefits and alternatives of the surgery were discussed including the risk of blood loss; infection; damage to nerves, vessels, tendons, ligaments, bone; failure of surgery; need for additional surgery; complications with wound healing; continued pain; nonunion; malunion; stiffness.  She voiced understanding of these risks and elected to proceed.  OPERATIVE COURSE:  After being identified preoperatively by myself, the patient and I agreed upon the procedure and site of procedure.  Surgical site was marked.  The risks, benefits, and alternatives of the surgery were reviewed and she wished to proceed.  Surgical consent had been signed.   She had given IV Ancef as preoperative antibiotic prophylaxis. Regional block was performed by Anesthesia in preoperative holding.  She was transferred to the operating room and placed in the operating room table in supine position with the right upper extremity on an armboard. General anesthesia was induced by Anesthesiology.  Right upper extremity was prepped and draped in normal sterile orthopedic fashion.  A surgical pause was performed between the surgeons, anesthesia, and operating room staff and all were in agreement as to the patient, procedure and site of procedure.  Tourniquet at the proximal aspect of the extremity was inflated to 250 mmHg after exsanguination of the limb with an Esmarch bandage.  Incision was made at the volar aspect of the wrist.  This was different than usual due to the extended nature of her carpal tunnel incision and this was incorporated into the current incision.  This was carried into subcutaneous tissues by spreading technique.  Bipolar electrocautery was used to obtain hemostasis.  The FCR tendon sheath was identified.  The superficial and deep portions of the sheath were incised and the FCR and FPL swept ulnarly to protect the palmar cutaneous branch of the median nerve.  The brachioradialis was identified at the radial side of the radius and released.  The  pronator quadratus was released and elevated with the periosteal elevator.  The malunion was identified.  There was significant callus formation.  This was able to be freed up.  Osteotomes were used to perform osteotomy through the fracture site.  Callus at the radial side of the radius was removed and saved for later use.  The dorsal periosteum was released to allow reduction.  C-arm was used in AP, lateral, and oblique projections throughout the case to aid in reduction.  What was found that the fracture could be reduced to regain neutral to slight volar tilt and good radial inclination, but  length was not able to be achieved to eliminate any ulnar positivity without excessive lengthening and gap in the bone.  The reduction was secured by securing one of the Acumed volar distal radial locking plate to the bone with the guidepins.  The C-arm was used in AP and lateral projections to ensure appropriate reduction and position of hardware, which was the case.  Standard AO drilling and measuring technique was used.  Single screw was placed in a slotted hole in the shaft of the plate.  The distal holes were filled with locking screws with the exception of one at the ulnar side, which was filled with locking peg.  Good purchase was obtained.  One hole at the distal radial side was also filled with locking peg.  The remaining holes in the shaft of the plate were filled with nonlocking screws.  C-arm was used in AP, lateral, and oblique projections to ensure appropriate reduction and position of hardware, which was the case.  Radial inclination and neutral volar tilt had been achieved.  She was  Ulna positive still.  Dorsal displacement had been reduced.  Some of the dorsal callus was removed with a rongeur to provide better contour.  The fracture site was then grafted with cancellous bone chips.  The wound had been copiously irrigated with sterile saline.  The pronator quadratus was repaired back over top of the plate with 4-0 Vicryl suture.  Three inverted interrupted Vicryl sutures were placed in the subcutaneous tissues and the skin was closed with 4-0 nylon in a horizontal mattress fashion.  The wound was dressed with sterile Xeroform, 4x4s, and wrapped with a Kerlix bandage.  A volar splint was placed and wrapped with Kerlix and Ace bandage.  Tourniquet was deflated at 82 minutes.  Fingertips were pink with brisk capillary refill after deflation of the tourniquet.  Operative were broken down and the patient was awoken from anesthesia safely.  She was transferred back to  the stretcher and taken to the PACU in stable condition.  I will see her back in the office in 1 week for postoperative followup.  I will give her Percocet 5/325, 1-2 p.o. q.6 hours p.r.n. pain, dispensed #40.     Leanora Cover, MD     KK/MEDQ  D:  04/18/2015  T:  04/19/2015  Job:  ID:[id number]  Addendum (04/20/15): word correction.

## 2015-04-22 ENCOUNTER — Encounter (HOSPITAL_BASED_OUTPATIENT_CLINIC_OR_DEPARTMENT_OTHER): Payer: Self-pay | Admitting: Orthopedic Surgery

## 2015-04-26 DIAGNOSIS — M25531 Pain in right wrist: Secondary | ICD-10-CM | POA: Diagnosis not present

## 2015-04-26 DIAGNOSIS — S52501P Unspecified fracture of the lower end of right radius, subsequent encounter for closed fracture with malunion: Secondary | ICD-10-CM | POA: Diagnosis not present

## 2015-04-26 DIAGNOSIS — S52501A Unspecified fracture of the lower end of right radius, initial encounter for closed fracture: Secondary | ICD-10-CM | POA: Diagnosis not present

## 2015-04-26 DIAGNOSIS — M25639 Stiffness of unspecified wrist, not elsewhere classified: Secondary | ICD-10-CM | POA: Diagnosis not present

## 2015-05-09 DIAGNOSIS — H9212 Otorrhea, left ear: Secondary | ICD-10-CM | POA: Diagnosis not present

## 2015-05-09 DIAGNOSIS — H93292 Other abnormal auditory perceptions, left ear: Secondary | ICD-10-CM | POA: Diagnosis not present

## 2015-05-09 DIAGNOSIS — H903 Sensorineural hearing loss, bilateral: Secondary | ICD-10-CM | POA: Diagnosis not present

## 2015-05-22 DIAGNOSIS — S52501P Unspecified fracture of the lower end of right radius, subsequent encounter for closed fracture with malunion: Secondary | ICD-10-CM | POA: Diagnosis not present

## 2015-07-15 DIAGNOSIS — S52501P Unspecified fracture of the lower end of right radius, subsequent encounter for closed fracture with malunion: Secondary | ICD-10-CM | POA: Diagnosis not present

## 2015-07-29 DIAGNOSIS — G5601 Carpal tunnel syndrome, right upper limb: Secondary | ICD-10-CM | POA: Diagnosis not present

## 2015-07-29 DIAGNOSIS — S63591A Other specified sprain of right wrist, initial encounter: Secondary | ICD-10-CM | POA: Diagnosis not present

## 2015-07-29 DIAGNOSIS — M189 Osteoarthritis of first carpometacarpal joint, unspecified: Secondary | ICD-10-CM | POA: Diagnosis not present

## 2015-07-29 DIAGNOSIS — M25831 Other specified joint disorders, right wrist: Secondary | ICD-10-CM | POA: Diagnosis not present

## 2015-07-29 DIAGNOSIS — S6991XA Unspecified injury of right wrist, hand and finger(s), initial encounter: Secondary | ICD-10-CM | POA: Diagnosis not present

## 2015-07-29 DIAGNOSIS — M19031 Primary osteoarthritis, right wrist: Secondary | ICD-10-CM | POA: Diagnosis not present

## 2015-07-29 DIAGNOSIS — S52501P Unspecified fracture of the lower end of right radius, subsequent encounter for closed fracture with malunion: Secondary | ICD-10-CM | POA: Diagnosis not present

## 2015-07-30 DIAGNOSIS — M25831 Other specified joint disorders, right wrist: Secondary | ICD-10-CM | POA: Diagnosis not present

## 2015-07-30 DIAGNOSIS — S52501P Unspecified fracture of the lower end of right radius, subsequent encounter for closed fracture with malunion: Secondary | ICD-10-CM | POA: Diagnosis not present

## 2015-07-31 ENCOUNTER — Other Ambulatory Visit: Payer: Self-pay | Admitting: Cardiology

## 2015-08-01 ENCOUNTER — Other Ambulatory Visit: Payer: Self-pay | Admitting: Orthopedic Surgery

## 2015-08-16 DIAGNOSIS — E7800 Pure hypercholesterolemia, unspecified: Secondary | ICD-10-CM | POA: Diagnosis not present

## 2015-08-16 DIAGNOSIS — R569 Unspecified convulsions: Secondary | ICD-10-CM | POA: Diagnosis not present

## 2015-08-16 DIAGNOSIS — I10 Essential (primary) hypertension: Secondary | ICD-10-CM | POA: Diagnosis not present

## 2015-08-16 DIAGNOSIS — I714 Abdominal aortic aneurysm, without rupture: Secondary | ICD-10-CM | POA: Diagnosis not present

## 2015-08-21 ENCOUNTER — Encounter (HOSPITAL_BASED_OUTPATIENT_CLINIC_OR_DEPARTMENT_OTHER): Payer: Self-pay | Admitting: *Deleted

## 2015-08-27 ENCOUNTER — Ambulatory Visit (HOSPITAL_BASED_OUTPATIENT_CLINIC_OR_DEPARTMENT_OTHER): Payer: Medicare Other | Admitting: Anesthesiology

## 2015-08-27 ENCOUNTER — Encounter (HOSPITAL_BASED_OUTPATIENT_CLINIC_OR_DEPARTMENT_OTHER): Payer: Self-pay | Admitting: *Deleted

## 2015-08-27 ENCOUNTER — Ambulatory Visit (HOSPITAL_BASED_OUTPATIENT_CLINIC_OR_DEPARTMENT_OTHER)
Admission: RE | Admit: 2015-08-27 | Discharge: 2015-08-27 | Disposition: A | Discharge status: Home / Self Care | Payer: Medicare Other | Source: Ambulatory Visit | Attending: Orthopedic Surgery | Admitting: Orthopedic Surgery

## 2015-08-27 ENCOUNTER — Encounter (HOSPITAL_BASED_OUTPATIENT_CLINIC_OR_DEPARTMENT_OTHER)
Admission: RE | Disposition: A | Discharge status: Home / Self Care | Payer: Self-pay | Source: Ambulatory Visit | Attending: Orthopedic Surgery

## 2015-08-27 DIAGNOSIS — Z79899 Other long term (current) drug therapy: Secondary | ICD-10-CM | POA: Insufficient documentation

## 2015-08-27 DIAGNOSIS — E039 Hypothyroidism, unspecified: Secondary | ICD-10-CM | POA: Insufficient documentation

## 2015-08-27 DIAGNOSIS — Z8572 Personal history of non-Hodgkin lymphomas: Secondary | ICD-10-CM | POA: Diagnosis not present

## 2015-08-27 DIAGNOSIS — I10 Essential (primary) hypertension: Secondary | ICD-10-CM | POA: Insufficient documentation

## 2015-08-27 DIAGNOSIS — E669 Obesity, unspecified: Secondary | ICD-10-CM | POA: Insufficient documentation

## 2015-08-27 DIAGNOSIS — Z7982 Long term (current) use of aspirin: Secondary | ICD-10-CM | POA: Diagnosis not present

## 2015-08-27 DIAGNOSIS — I2510 Atherosclerotic heart disease of native coronary artery without angina pectoris: Secondary | ICD-10-CM | POA: Insufficient documentation

## 2015-08-27 DIAGNOSIS — R569 Unspecified convulsions: Secondary | ICD-10-CM | POA: Insufficient documentation

## 2015-08-27 DIAGNOSIS — M24831 Other specific joint derangements of right wrist, not elsewhere classified: Secondary | ICD-10-CM | POA: Insufficient documentation

## 2015-08-27 DIAGNOSIS — Z6836 Body mass index (BMI) 36.0-36.9, adult: Secondary | ICD-10-CM | POA: Insufficient documentation

## 2015-08-27 DIAGNOSIS — F329 Major depressive disorder, single episode, unspecified: Secondary | ICD-10-CM | POA: Diagnosis not present

## 2015-08-27 HISTORY — DX: Major depressive disorder, single episode, unspecified: F32.9

## 2015-08-27 HISTORY — PX: ULNA OSTEOTOMY: SHX1077

## 2015-08-27 HISTORY — DX: Anxiety disorder, unspecified: F41.9

## 2015-08-27 HISTORY — DX: Essential (primary) hypertension: I10

## 2015-08-27 HISTORY — DX: Depression, unspecified: F32.A

## 2015-08-27 SURGERY — SHORTENING, ULNA
Anesthesia: General | Site: Wrist | Laterality: Right

## 2015-08-27 MED ORDER — ONDANSETRON HCL 4 MG/2ML IJ SOLN
INTRAMUSCULAR | Status: DC | PRN
  Administered 2015-08-27: 4 mg via INTRAVENOUS

## 2015-08-27 MED ORDER — FENTANYL CITRATE (PF) 100 MCG/2ML IJ SOLN
50.0000 ug | INTRAMUSCULAR | Status: AC | PRN
  Administered 2015-08-27: 100 ug via INTRAVENOUS
  Administered 2015-08-27 (×2): 25 ug via INTRAVENOUS

## 2015-08-27 MED ORDER — PROMETHAZINE HCL 25 MG/ML IJ SOLN: 6.2500 mg | INTRAMUSCULAR | Status: DC | PRN

## 2015-08-27 MED ORDER — OXYCODONE-ACETAMINOPHEN 5-325 MG PO TABS
ORAL_TABLET | ORAL | Status: AC
  Filled 2015-08-27: qty 1

## 2015-08-27 MED ORDER — LACTATED RINGERS IV SOLN
INTRAVENOUS | Status: DC
  Administered 2015-08-27 (×2): via INTRAVENOUS

## 2015-08-27 MED ORDER — SCOPOLAMINE 1 MG/3DAYS TD PT72: 1.0000 | MEDICATED_PATCH | Freq: Once | TRANSDERMAL | Status: DC | PRN

## 2015-08-27 MED ORDER — DEXAMETHASONE SODIUM PHOSPHATE 4 MG/ML IJ SOLN
INTRAMUSCULAR | Status: DC | PRN
  Administered 2015-08-27: 10 mg via INTRAVENOUS

## 2015-08-27 MED ORDER — LACTATED RINGERS IV SOLN: INTRAVENOUS | Status: DC

## 2015-08-27 MED ORDER — ONDANSETRON HCL 4 MG/2ML IJ SOLN
INTRAMUSCULAR | Status: AC
  Filled 2015-08-27: qty 2

## 2015-08-27 MED ORDER — FENTANYL CITRATE (PF) 100 MCG/2ML IJ SOLN
INTRAMUSCULAR | Status: AC
  Filled 2015-08-27: qty 2

## 2015-08-27 MED ORDER — CEFAZOLIN SODIUM-DEXTROSE 2-4 GM/100ML-% IV SOLN
2.0000 g | INTRAVENOUS | Status: AC
  Administered 2015-08-27: 2 g via INTRAVENOUS

## 2015-08-27 MED ORDER — LIDOCAINE 2% (20 MG/ML) 5 ML SYRINGE
INTRAMUSCULAR | Status: DC | PRN
  Administered 2015-08-27: 75 mg via INTRAVENOUS

## 2015-08-27 MED ORDER — OXYCODONE HCL 5 MG PO TABS
5.0000 mg | ORAL_TABLET | Freq: Once | ORAL | Status: AC
  Administered 2015-08-27: 5 mg via ORAL

## 2015-08-27 MED ORDER — GLYCOPYRROLATE 0.2 MG/ML IJ SOLN: 0.2000 mg | Freq: Once | INTRAMUSCULAR | Status: DC | PRN

## 2015-08-27 MED ORDER — BUPIVACAINE HCL (PF) 0.25 % IJ SOLN
INTRAMUSCULAR | Status: DC | PRN
  Administered 2015-08-27: 10 mL

## 2015-08-27 MED ORDER — MIDAZOLAM HCL 2 MG/2ML IJ SOLN
INTRAMUSCULAR | Status: AC
  Filled 2015-08-27: qty 2

## 2015-08-27 MED ORDER — HYDROMORPHONE HCL 1 MG/ML IJ SOLN: 0.2500 mg | INTRAMUSCULAR | Status: DC | PRN

## 2015-08-27 MED ORDER — OXYCODONE HCL 5 MG PO TABS
ORAL_TABLET | ORAL | Status: AC
  Filled 2015-08-27: qty 1

## 2015-08-27 MED ORDER — MEPERIDINE HCL 25 MG/ML IJ SOLN: 6.2500 mg | INTRAMUSCULAR | Status: DC | PRN

## 2015-08-27 MED ORDER — EPHEDRINE SULFATE-NACL 50-0.9 MG/10ML-% IV SOSY
PREFILLED_SYRINGE | INTRAVENOUS | Status: DC | PRN
  Administered 2015-08-27: 10 mg via INTRAVENOUS

## 2015-08-27 MED ORDER — DEXAMETHASONE SODIUM PHOSPHATE 10 MG/ML IJ SOLN
INTRAMUSCULAR | Status: AC
  Filled 2015-08-27: qty 1

## 2015-08-27 MED ORDER — CEFAZOLIN SODIUM-DEXTROSE 2-4 GM/100ML-% IV SOLN
INTRAVENOUS | Status: AC
  Filled 2015-08-27: qty 100

## 2015-08-27 MED ORDER — PROPOFOL 10 MG/ML IV BOLUS
INTRAVENOUS | Status: DC | PRN
  Administered 2015-08-27: 200 mg via INTRAVENOUS

## 2015-08-27 MED ORDER — MIDAZOLAM HCL 2 MG/2ML IJ SOLN
1.0000 mg | INTRAMUSCULAR | Status: DC | PRN
  Administered 2015-08-27: 2 mg via INTRAVENOUS

## 2015-08-27 MED ORDER — CHLORHEXIDINE GLUCONATE 4 % EX LIQD: 60.0000 mL | Freq: Once | CUTANEOUS | Status: DC

## 2015-08-27 MED ORDER — OXYCODONE-ACETAMINOPHEN 5-325 MG PO TABS: ORAL_TABLET | ORAL | 0 refills | Status: DC

## 2015-08-27 SURGICAL SUPPLY — 59 items
BAG DECANTER FOR FLEXI CONT (MISCELLANEOUS) ×4 IMPLANT
BANDAGE ACE 4X5 VEL STRL LF (GAUZE/BANDAGES/DRESSINGS) IMPLANT
BLADE ARTHRO LOK 4 BEAVER (BLADE) IMPLANT
BLADE ARTHRO LOK 4MM BEAVER (BLADE)
BLADE EAR TYMPAN 2.5 60D BEAV (BLADE) ×4 IMPLANT
BLADE LONG MED 25X9 (BLADE) ×3 IMPLANT
BLADE LONG MED 25X9MM (BLADE) ×1
BLADE MINI RND TIP GREEN BEAV (BLADE) ×4 IMPLANT
BLADE SURG 15 STRL LF DISP TIS (BLADE) ×4 IMPLANT
BLADE SURG 15 STRL SS (BLADE) ×4
BNDG ESMARK 4X9 LF (GAUZE/BANDAGES/DRESSINGS) ×4 IMPLANT
BNDG GAUZE ELAST 4 BULKY (GAUZE/BANDAGES/DRESSINGS) ×4 IMPLANT
CHLORAPREP W/TINT 26ML (MISCELLANEOUS) ×4 IMPLANT
CORDS BIPOLAR (ELECTRODE) ×4 IMPLANT
COVER BACK TABLE 60X90IN (DRAPES) ×4 IMPLANT
COVER MAYO STAND STRL (DRAPES) ×4 IMPLANT
CUFF TOURNIQUET SINGLE 18IN (TOURNIQUET CUFF) ×4 IMPLANT
DECANTER SPIKE VIAL GLASS SM (MISCELLANEOUS) IMPLANT
DRAPE EXTREMITY T 121X128X90 (DRAPE) ×4 IMPLANT
DRAPE OEC MINIVIEW 54X84 (DRAPES) ×4 IMPLANT
DRAPE SURG 17X23 STRL (DRAPES) ×4 IMPLANT
GAUZE SPONGE 4X4 12PLY STRL (GAUZE/BANDAGES/DRESSINGS) ×4 IMPLANT
GAUZE XEROFORM 1X8 LF (GAUZE/BANDAGES/DRESSINGS) ×4 IMPLANT
GLOVE BIO SURGEON STRL SZ7.5 (GLOVE) ×4 IMPLANT
GLOVE BIOGEL PI IND STRL 7.0 (GLOVE) ×6 IMPLANT
GLOVE BIOGEL PI IND STRL 8 (GLOVE) ×2 IMPLANT
GLOVE BIOGEL PI IND STRL 8.5 (GLOVE) ×2 IMPLANT
GLOVE BIOGEL PI INDICATOR 7.0 (GLOVE) ×6
GLOVE BIOGEL PI INDICATOR 8 (GLOVE) ×2
GLOVE BIOGEL PI INDICATOR 8.5 (GLOVE) ×2
GLOVE ECLIPSE 6.5 STRL STRAW (GLOVE) ×8 IMPLANT
GLOVE SURG ORTHO 8.0 STRL STRW (GLOVE) ×4 IMPLANT
GOWN STRL REUS W/ TWL LRG LVL3 (GOWN DISPOSABLE) ×4 IMPLANT
GOWN STRL REUS W/TWL LRG LVL3 (GOWN DISPOSABLE) ×4
GOWN STRL REUS W/TWL XL LVL3 (GOWN DISPOSABLE) ×8 IMPLANT
NS IRRIG 1000ML POUR BTL (IV SOLUTION) ×4 IMPLANT
PACK BASIN DAY SURGERY FS (CUSTOM PROCEDURE TRAY) ×4 IMPLANT
PAD CAST 4YDX4 CTTN HI CHSV (CAST SUPPLIES) IMPLANT
PADDING CAST ABS 3INX4YD NS (CAST SUPPLIES)
PADDING CAST ABS 4INX4YD NS (CAST SUPPLIES)
PADDING CAST ABS COTTON 3X4 (CAST SUPPLIES) IMPLANT
PADDING CAST ABS COTTON 4X4 ST (CAST SUPPLIES) IMPLANT
PADDING CAST COTTON 4X4 STRL (CAST SUPPLIES)
SLEEVE SCD COMPRESS KNEE MED (MISCELLANEOUS) ×4 IMPLANT
SPLINT PLASTER CAST XFAST 3X15 (CAST SUPPLIES) IMPLANT
SPLINT PLASTER XTRA FASTSET 3X (CAST SUPPLIES)
STOCKINETTE 4X48 STRL (DRAPES) ×4 IMPLANT
SUT ETHIBOND 3-0 V-5 (SUTURE) ×4 IMPLANT
SUT ETHILON 4 0 PS 2 18 (SUTURE) ×4 IMPLANT
SUT MERSILENE 3 0 FS 1 (SUTURE) ×4 IMPLANT
SUT VIC AB 2-0 SH 27 (SUTURE)
SUT VIC AB 2-0 SH 27XBRD (SUTURE) IMPLANT
SUT VICRYL 4-0 PS2 18IN ABS (SUTURE) IMPLANT
SYR 20CC LL (SYRINGE) IMPLANT
SYR BULB 3OZ (MISCELLANEOUS) ×4 IMPLANT
SYR CONTROL 10ML LL (SYRINGE) ×4 IMPLANT
TOWEL OR 17X24 6PK STRL BLUE (TOWEL DISPOSABLE) ×4 IMPLANT
TOWEL OR NON WOVEN STRL DISP B (DISPOSABLE) ×4 IMPLANT
UNDERPAD 30X30 (UNDERPADS AND DIAPERS) IMPLANT

## 2015-08-27 NOTE — Anesthesia Postprocedure Evaluation (Signed)
Anesthesia Post Note  Patient: Jamie Hurst  Procedure(s) Performed: Procedure(s) (LRB): Right wrist Darrach (Right)  Patient location during evaluation: PACU Anesthesia Type: General Level of consciousness: awake and alert Pain management: pain level controlled Vital Signs Assessment: post-procedure vital signs reviewed and stable Respiratory status: spontaneous breathing, nonlabored ventilation, respiratory function stable and patient connected to nasal cannula oxygen Cardiovascular status: blood pressure returned to baseline and stable Postop Assessment: no signs of nausea or vomiting Anesthetic complications: no    Last Vitals:  Vitals:   08/27/15 1500 08/27/15 1515  BP: 123/76 104/75  Pulse: 80 77  Resp: 12 (!) 22  Temp:      Last Pain:  Vitals:   08/27/15 1515  TempSrc:   PainSc: [place] Tillmon Kisling

## 2015-08-27 NOTE — Transfer of Care (Signed)
Immediate Anesthesia Transfer of Care Note  Patient: Jamie Hurst  Procedure(s) Performed: Procedure(s): Right wrist Darrach (Right)  Patient Location: PACU  Anesthesia Type:General  Level of Consciousness: awake, sedated and patient cooperative  Airway & Oxygen Therapy: Patient Spontanous Breathing and Patient connected to face mask oxygen  Post-op Assessment: Report given to RN and Post -op Vital signs reviewed and stable  Post vital signs: Reviewed and stable  Last Vitals:  Vitals:   08/27/15 1227  BP: (!) 160/87  Pulse: 88  Resp: 20  Temp: 36.5 C    Last Pain:  Vitals:   08/27/15 1227  TempSrc: Oral  PainSc: 5          Complications: No apparent anesthesia complications

## 2015-08-27 NOTE — Discharge Instructions (Addendum)

## 2015-08-27 NOTE — Op Note (Signed)
[id number] 

## 2015-08-27 NOTE — Op Note (Signed)
I assisted Surgeon(s) and Role:    * Leanora Cover, MD - Primary    * Daryll Brod, MD on the Procedure(s): Right wrist Darrach on 08/27/2015.  I provided assistance on this case as follows: approach, isolation of the distal ulna, osteotomy of the ulna, removal of the ulna, harvesting the ECU for transfer, stabilization of the ulna with the graft. Closure of the wound and application of dressing and splint. I was present for the entire case.  Electronically signed by: Wynonia Sours, MD Date: 08/27/2015 Time: 2:48 PM

## 2015-08-27 NOTE — H&P (Signed)
Jamie Hurst is an 70 y.o. female.   Chief Complaint: right wrist ulnar impaction HPI: 16 female s/p orif right distal radius with ulnar impaction.  This is bothersome to her.  She wishes to have distal ulna resection for management of the symptoms.  Allergies: Not on File  Past Medical History:  Diagnosis Date  . Allergic rhinitis   . 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 Anxiety   . CAD (coronary artery disease)   . Depression   . GERD (gastroesophageal reflux disease)   . HTN (hypertension) 06/13/2013  . Hypertension   . Hypothyroidism   . IBS (irritable bowel syndrome)   . Non Hodgkin's lymphoma ([place])   . Seizures ([place])    stress related hx of 1 seizure in 2012, last seizure 6 yrs ago  . Unspecified essential hypertension     Past Surgical History:  Procedure Laterality Date  . ABDOMINAL HYSTERECTOMY  1977  . LAMINECTOMY  2013   x's 2  1st 2003 and 2nd 2013  . LEFT HEART CATHETERIZATION WITH CORONARY ANGIOGRAM N/A 06/14/2013   Procedure: LEFT HEART CATHETERIZATION WITH CORONARY ANGIOGRAM;  Surgeon: Peter M Martinique, MD;  Location: [facility] CATH LAB;  Service: Cardiovascular;  Laterality: N/A;  . OPEN REDUCTION INTERNAL FIXATION (ORIF) DISTAL RADIAL FRACTURE Right 04/18/2015   Procedure: OPEN REDUCTION INTERNAL FIXATION (ORIF) RIGHT  DISTAL RADIUS OSTEOTOMY;  Surgeon: Leanora Cover, MD;  Location: [facility];  Service: Orthopedics;  Laterality: Right;  . THORACOTOMY  40   age 24  . WRIST CAPSULOTOMY      Family History: History reviewed. No pertinent family history.  Social History:   reports that she has never smoked. She has never used smokeless tobacco. She reports that she does not drink alcohol or use drugs.  Medications: Medications Prior to Admission  Medication Sig Dispense Refill  . aspirin EC 81 MG EC tablet Take 1 tablet (81 mg total) by mouth daily.    . diphenoxylate-atropine (LOMOTIL) 2.5-0.025 MG per tablet Take 2 tablets by mouth 3  (three) times daily as needed for diarrhea or loose stools.     Marland Kitchen FLUoxetine (PROZAC) 40 MG capsule Take 40 mg by mouth daily.    Marland Kitchen levETIRAcetam (KEPPRA) 500 MG tablet Take 1 tablet by mouth 2 (two) times daily.    Marland Kitchen levothyroxine (SYNTHROID, LEVOTHROID) 100 MCG tablet Take 1 tablet by mouth daily.    . metoprolol succinate (TOPROL-XL) 25 MG 24 hr tablet TAKE 1 TABLET(25 MG) BY MOUTH DAILY 30 tablet 3  . temazepam (RESTORIL) 15 MG capsule Take 15 mg by mouth at bedtime as needed for sleep.    Marland Kitchen topiramate (TOPAMAX) 50 MG tablet Take 1 tablet by mouth 2 (two) times daily as needed.      No results found for this or any previous visit (from the past 48 hour(s)).  No results found.   A comprehensive review of systems was negative.  Blood pressure (!) 160/87, pulse 88, temperature 97.7 F (36.5 C), temperature source Oral, resp. rate 20, height 5\' 6"  (1.676 m), weight 103.4 kg (228 lb), SpO2 96 %.  General appearance: alert, cooperative and appears stated age Head: Normocephalic, without obvious abnormality, atraumatic Neck: supple, symmetrical, trachea midline Resp: clear to auscultation bilaterally Cardio: regular rate and rhythm GI: non-tender Extremities: Intact sensation and capillary refill all digits.  +epl/fpl/io.  No wounds.  Pulses: 2+ and symmetric Skin: Skin color, texture, turgor normal. No rashes or lesions Neurologic:  Grossly normal Incision/Wound:none  Assessment/Plan Right ulnar impaction syndrome.  Non operative and operative treatment options were discussed with the patient and patient wishes to proceed with operative treatment. Risks, benefits, and alternatives of surgery were discussed and the patient agrees with the plan of care.   Valeda Corzine R 08/27/2015, 1:37 PM

## 2015-08-27 NOTE — Anesthesia Preprocedure Evaluation (Addendum)
Anesthesia Evaluation  Patient identified by MRN, date of birth, ID band Patient awake    Reviewed: Allergy & Precautions, NPO status , Patient's Chart, lab work & pertinent test results  Airway Mallampati: I  TM Distance: >3 FB Neck ROM: Full    Dental  (+) Teeth Intact, Dental Advisory Given   Pulmonary neg pulmonary ROS,    breath sounds clear to auscultation       Cardiovascular hypertension, Pt. on medications and Pt. on home beta blockers + angina + CAD and + Peripheral Vascular Disease   Rhythm:Regular Rate:Normal     Neuro/Psych Seizures -,  PSYCHIATRIC DISORDERS Anxiety Depression    GI/Hepatic Neg liver ROS, GERD  Medicated,  Endo/Other  Hypothyroidism   Renal/GU negative Renal ROS  negative genitourinary   Musculoskeletal negative musculoskeletal ROS (+)   Abdominal (+) + obese,   Peds negative pediatric ROS (+)  Hematology negative hematology ROS (+)   Anesthesia Other Findings   Reproductive/Obstetrics negative OB ROS                            Lab Results  Component Value Date   WBC 3.5 (L) 06/15/2013   HGB 11.7 (L) 06/15/2013   HCT 37.2 06/15/2013   MCV 91.2 06/15/2013   PLT 147 (L) 06/15/2013   Lab Results  Component Value Date   CREATININE 0.92 07/18/2014   BUN 15 07/18/2014   NA 139 07/18/2014   K 3.4 (L) 07/18/2014   CL 97 (L) 07/18/2014   CO2 29 07/18/2014   Lab Results  Component Value Date   INR 1.04 06/14/2013   07/2014 EKG: normal sinus rhythm.  Anesthesia Physical Anesthesia Plan  ASA: III  Anesthesia Plan: General   Post-op Pain Management:    Induction: Intravenous  Airway Management Planned: LMA  Additional Equipment:   Intra-op Plan:   Post-operative Plan: Extubation in OR  Informed Consent: I have reviewed the patients History and Physical, chart, labs and discussed the procedure including the risks, benefits and alternatives for  the proposed anesthesia with the patient or authorized representative who has indicated his/her understanding and acceptance.   Dental advisory given  Plan Discussed with: CRNA  Anesthesia Plan Comments: (Pt declined block. )       Anesthesia Quick Evaluation

## 2015-08-27 NOTE — Anesthesia Procedure Notes (Signed)
Procedure Name: LMA Insertion Date/Time: 08/27/2015 1:55 PM Performed by: Lyndee Leo Pre-anesthesia Checklist: Patient identified, Emergency Drugs available, Suction available and Patient being monitored Patient Re-evaluated:Patient Re-evaluated prior to inductionOxygen Delivery Method: Circle system utilized Preoxygenation: Pre-oxygenation with 100% oxygen Intubation Type: IV induction Ventilation: Mask ventilation without difficulty LMA: LMA inserted LMA Size: 4.0 Number of attempts: 1 Airway Equipment and Method: Bite block Placement Confirmation: positive ETCO2 Tube secured with: Tape Dental Injury: Teeth and Oropharynx as per pre-operative assessment

## 2015-08-27 NOTE — Brief Op Note (Signed)
08/27/2015  2:47 PM  PATIENT:  Jamie Hurst  70 y.o. female  PRE-OPERATIVE DIAGNOSIS:  right wrist ulnarcarpal impaction  POST-OPERATIVE DIAGNOSIS:  right wrist ulnarcarpal impaction  PROCEDURE:  Procedure(s): Right wrist Darrach (Right)  SURGEON:  Surgeon(s) and Role:    * Leanora Cover, MD - Primary    * Daryll Brod, MD  PHYSICIAN ASSISTANT:   ASSISTANTS: Daryll Brod, MD   ANESTHESIA:   general  EBL:  Total I/O In: 200 [I.V.:200] Out: -   BLOOD ADMINISTERED:none  DRAINS: none   LOCAL MEDICATIONS USED:  MARCAINE     SPECIMEN:  No Specimen  DISPOSITION OF SPECIMEN:  N/A  COUNTS:  YES  TOURNIQUET:   Total Tourniquet Time Documented: Upper Arm (Right) - 36 minutes Total: Upper Arm (Right) - 36 minutes   DICTATION: .Other Dictation: Dictation Number [id number]  PLAN OF CARE: Discharge to home after PACU  PATIENT DISPOSITION:  PACU - hemodynamically stable.

## 2015-08-28 NOTE — Op Note (Signed)
[name], [name]:  [id number]  MEDICAL RECORD NO.:  [id number]  LOCATION:                                 FACILITY:  PHYSICIAN:  Leanora Cover, MD        DATE OF BIRTH:  March 09, 1945  DATE OF PROCEDURE:  08/27/2015 DATE OF DISCHARGE:                              OPERATIVE REPORT   PREOPERATIVE DIAGNOSIS:  Right ulnocarpal impaction.  POSTOPERATIVE DIAGNOSIS:  Right ulnocarpal impaction.  PROCEDURE:  Right distal ulna resection with stabilization with ECU slip.  SURGEON:  Leanora Cover, MD  ASSISTANT:  Daryll Brod, M.D.  ANESTHESIA:  General.  IV FLUIDS:  Per anesthesia flow sheet.  ESTIMATED BLOOD LOSS:  Minimal.  COMPLICATIONS:  None.  SPECIMENS:  None.  TOURNIQUET TIME:  36 minutes.  DISPOSITION:  Stable to PACU.  INDICATIONS:  Ms. Michels is a 70 year old female who underwent distal radius open reduction and internal fixation with subsequent ulnar impaction syndrome.  She wished to have a distal ulna resection for management of the pain.  Risks, benefits, and alternatives of the surgery were discussed including the risk of blood loss; infection; damage to nerves, vessels, tendons, ligaments, bone, failure of surgery, need for additional surgery, complications with wound healing, continued pain.  She voiced understanding of these risks and elected to proceed.  OPERATIVE COURSE:  After being identified preoperatively by myself, the patient agreed upon procedure and site of procedure.  Surgical site was marked.  The risks, benefits, and alternatives of surgery were reviewed and she wished to proceed.  Surgical consent had been signed.  She was given IV Ancef as preoperative antibiotic prophylaxis.  She was transported to the operating room and placed on the operating room table in a supine position with the right upper extremity on arm board. General anesthesia induced by Anesthesiology.  Right upper extremity was prepped and draped in  normal sterile orthopedic fashion.  Surgical pause was performed between surgeons, anesthesia, and operating room staff and all were in agreement as to the patient, procedure, and site of procedure.  Tourniquet at the proximal aspect of the extremity was inflated to 250 mmHg after exsanguination of the limb with an Esmarch bandage.  An incision was made at the dorsal aspect of the distal ulna. This was carried subcutaneous tissues by spreading technique.  The fascia was sharply incised.  The EDQ and ECU tendons were identified and protected.  The capsule was incised.  The distal ulna was freed of soft tissue attachments.  The C-arm was used in AP plane to localize the appropriate level for the resection.  An oscillating saw was then used to cut the distal ulna transversely.  The distal ulna was removed.  The ends of the distal ulna were rounded with a bone rasp.  A portion of the ECU tendon was taken with distal base.  This was then passed into the canal of the distal ulna and sutured over drill holes using 3-0 Ethibond suture.  The wound was then copiously irrigated.  The fascia and capsule were repaired with a 2-0 Vicryl suture in a running fashion.  The skin was closed with 4-0 nylon in a horizontal mattress fashion.  The  resection and wound were injected with 10 mL of 0.25% plain Marcaine to aid in postoperative analgesia.  The wound was then dressed with sterile Xeroform, 4x4s, and wrapped with a Kerlix bandage.  A volar splint was placed and wrapped with Kerlix and Ace bandage.  Tourniquet was deflated at 36 minutes.  Fingertips were pink with brisk capillary refill after deflation of the tourniquet.  Operative drapes were broken down.  The patient was awoken from anesthesia safely.  She was transferred back to stretcher and taken to PACU in stable condition.  I will see her back in the office in 1 week for postoperative followup.  I will give her Percocet 5/325 one to two p.o. q.6  hours p.r.n. pain, dispensed #30.     Leanora Cover, MD   ______________________________ Leanora Cover, MD    KK/MEDQ  D:  08/27/2015  T:  08/27/2015  Job:  [id number]

## 2015-08-29 ENCOUNTER — Encounter (HOSPITAL_BASED_OUTPATIENT_CLINIC_OR_DEPARTMENT_OTHER): Payer: Self-pay | Admitting: Orthopedic Surgery

## 2015-09-03 DIAGNOSIS — M25531 Pain in right wrist: Secondary | ICD-10-CM | POA: Diagnosis not present

## 2015-09-03 DIAGNOSIS — M25831 Other specified joint disorders, right wrist: Secondary | ICD-10-CM | POA: Diagnosis not present

## 2015-09-23 DIAGNOSIS — I10 Essential (primary) hypertension: Secondary | ICD-10-CM | POA: Diagnosis not present

## 2015-09-23 DIAGNOSIS — Z6836 Body mass index (BMI) 36.0-36.9, adult: Secondary | ICD-10-CM | POA: Diagnosis not present

## 2015-09-23 DIAGNOSIS — I2510 Atherosclerotic heart disease of native coronary artery without angina pectoris: Secondary | ICD-10-CM | POA: Diagnosis not present

## 2015-09-23 DIAGNOSIS — E7800 Pure hypercholesterolemia, unspecified: Secondary | ICD-10-CM | POA: Diagnosis not present

## 2015-09-23 DIAGNOSIS — Z789 Other specified health status: Secondary | ICD-10-CM | POA: Diagnosis not present

## 2015-09-23 DIAGNOSIS — J069 Acute upper respiratory infection, unspecified: Secondary | ICD-10-CM | POA: Diagnosis not present

## 2015-10-04 DIAGNOSIS — M25831 Other specified joint disorders, right wrist: Secondary | ICD-10-CM | POA: Diagnosis not present

## 2015-10-11 DIAGNOSIS — Z6836 Body mass index (BMI) 36.0-36.9, adult: Secondary | ICD-10-CM | POA: Diagnosis not present

## 2015-10-11 DIAGNOSIS — Z789 Other specified health status: Secondary | ICD-10-CM | POA: Diagnosis not present

## 2015-10-11 DIAGNOSIS — E7800 Pure hypercholesterolemia, unspecified: Secondary | ICD-10-CM | POA: Diagnosis not present

## 2015-10-11 DIAGNOSIS — J029 Acute pharyngitis, unspecified: Secondary | ICD-10-CM | POA: Diagnosis not present

## 2015-10-11 DIAGNOSIS — I10 Essential (primary) hypertension: Secondary | ICD-10-CM | POA: Diagnosis not present

## 2015-11-18 DIAGNOSIS — Z789 Other specified health status: Secondary | ICD-10-CM | POA: Diagnosis not present

## 2015-11-18 DIAGNOSIS — J069 Acute upper respiratory infection, unspecified: Secondary | ICD-10-CM | POA: Diagnosis not present

## 2015-11-18 DIAGNOSIS — I10 Essential (primary) hypertension: Secondary | ICD-10-CM | POA: Diagnosis not present

## 2015-11-18 DIAGNOSIS — Z299 Encounter for prophylactic measures, unspecified: Secondary | ICD-10-CM | POA: Diagnosis not present

## 2015-11-18 DIAGNOSIS — G4700 Insomnia, unspecified: Secondary | ICD-10-CM | POA: Diagnosis not present

## 2015-11-22 DIAGNOSIS — M25831 Other specified joint disorders, right wrist: Secondary | ICD-10-CM | POA: Diagnosis not present

## 2015-12-10 DIAGNOSIS — R490 Dysphonia: Secondary | ICD-10-CM | POA: Diagnosis not present

## 2016-02-13 DIAGNOSIS — Z713 Dietary counseling and surveillance: Secondary | ICD-10-CM | POA: Diagnosis not present

## 2016-02-13 DIAGNOSIS — R569 Unspecified convulsions: Secondary | ICD-10-CM | POA: Diagnosis not present

## 2016-02-13 DIAGNOSIS — I10 Essential (primary) hypertension: Secondary | ICD-10-CM | POA: Diagnosis not present

## 2016-02-13 DIAGNOSIS — Z6836 Body mass index (BMI) 36.0-36.9, adult: Secondary | ICD-10-CM | POA: Diagnosis not present

## 2016-02-13 DIAGNOSIS — Z299 Encounter for prophylactic measures, unspecified: Secondary | ICD-10-CM | POA: Diagnosis not present

## 2016-02-13 DIAGNOSIS — I714 Abdominal aortic aneurysm, without rupture: Secondary | ICD-10-CM | POA: Diagnosis not present

## 2016-02-13 DIAGNOSIS — G43909 Migraine, unspecified, not intractable, without status migrainosus: Secondary | ICD-10-CM | POA: Diagnosis not present

## 2016-02-13 DIAGNOSIS — K589 Irritable bowel syndrome without diarrhea: Secondary | ICD-10-CM | POA: Diagnosis not present

## 2016-02-13 DIAGNOSIS — I2510 Atherosclerotic heart disease of native coronary artery without angina pectoris: Secondary | ICD-10-CM | POA: Diagnosis not present

## 2016-02-13 DIAGNOSIS — Z789 Other specified health status: Secondary | ICD-10-CM | POA: Diagnosis not present

## 2016-03-12 DIAGNOSIS — Z7189 Other specified counseling: Secondary | ICD-10-CM | POA: Diagnosis not present

## 2016-03-12 DIAGNOSIS — I2510 Atherosclerotic heart disease of native coronary artery without angina pectoris: Secondary | ICD-10-CM | POA: Diagnosis not present

## 2016-03-12 DIAGNOSIS — Z79899 Other long term (current) drug therapy: Secondary | ICD-10-CM | POA: Diagnosis not present

## 2016-03-12 DIAGNOSIS — Z1211 Encounter for screening for malignant neoplasm of colon: Secondary | ICD-10-CM | POA: Diagnosis not present

## 2016-03-12 DIAGNOSIS — G4700 Insomnia, unspecified: Secondary | ICD-10-CM | POA: Diagnosis not present

## 2016-03-12 DIAGNOSIS — Z1389 Encounter for screening for other disorder: Secondary | ICD-10-CM | POA: Diagnosis not present

## 2016-03-12 DIAGNOSIS — G43909 Migraine, unspecified, not intractable, without status migrainosus: Secondary | ICD-10-CM | POA: Diagnosis not present

## 2016-03-12 DIAGNOSIS — R5383 Other fatigue: Secondary | ICD-10-CM | POA: Diagnosis not present

## 2016-03-12 DIAGNOSIS — Z0000 Encounter for general adult medical examination without abnormal findings: Secondary | ICD-10-CM | POA: Diagnosis not present

## 2016-03-12 DIAGNOSIS — Z299 Encounter for prophylactic measures, unspecified: Secondary | ICD-10-CM | POA: Diagnosis not present

## 2016-03-12 DIAGNOSIS — E559 Vitamin D deficiency, unspecified: Secondary | ICD-10-CM | POA: Diagnosis not present

## 2016-03-12 DIAGNOSIS — E039 Hypothyroidism, unspecified: Secondary | ICD-10-CM | POA: Diagnosis not present

## 2016-03-12 DIAGNOSIS — E7800 Pure hypercholesterolemia, unspecified: Secondary | ICD-10-CM | POA: Diagnosis not present

## 2016-03-12 DIAGNOSIS — Z6836 Body mass index (BMI) 36.0-36.9, adult: Secondary | ICD-10-CM | POA: Diagnosis not present

## 2016-05-16 DIAGNOSIS — L0390 Cellulitis, unspecified: Secondary | ICD-10-CM | POA: Diagnosis not present

## 2016-05-16 DIAGNOSIS — T148XXA Other injury of unspecified body region, initial encounter: Secondary | ICD-10-CM | POA: Diagnosis not present

## 2016-05-18 DIAGNOSIS — L03119 Cellulitis of unspecified part of limb: Secondary | ICD-10-CM | POA: Diagnosis not present

## 2016-05-18 DIAGNOSIS — T148XXA Other injury of unspecified body region, initial encounter: Secondary | ICD-10-CM | POA: Diagnosis not present

## 2016-06-18 DIAGNOSIS — E669 Obesity, unspecified: Secondary | ICD-10-CM | POA: Diagnosis not present

## 2016-06-18 DIAGNOSIS — E039 Hypothyroidism, unspecified: Secondary | ICD-10-CM | POA: Diagnosis not present

## 2016-06-18 DIAGNOSIS — C8590 Non-Hodgkin lymphoma, unspecified, unspecified site: Secondary | ICD-10-CM | POA: Diagnosis not present

## 2016-06-18 DIAGNOSIS — R569 Unspecified convulsions: Secondary | ICD-10-CM | POA: Diagnosis not present

## 2016-06-18 DIAGNOSIS — Z6836 Body mass index (BMI) 36.0-36.9, adult: Secondary | ICD-10-CM | POA: Diagnosis not present

## 2016-06-18 DIAGNOSIS — K219 Gastro-esophageal reflux disease without esophagitis: Secondary | ICD-10-CM | POA: Diagnosis not present

## 2016-06-18 DIAGNOSIS — G5603 Carpal tunnel syndrome, bilateral upper limbs: Secondary | ICD-10-CM | POA: Diagnosis not present

## 2016-06-18 DIAGNOSIS — I2510 Atherosclerotic heart disease of native coronary artery without angina pectoris: Secondary | ICD-10-CM | POA: Diagnosis not present

## 2016-06-18 DIAGNOSIS — Z299 Encounter for prophylactic measures, unspecified: Secondary | ICD-10-CM | POA: Diagnosis not present

## 2016-06-18 DIAGNOSIS — G43909 Migraine, unspecified, not intractable, without status migrainosus: Secondary | ICD-10-CM | POA: Diagnosis not present

## 2016-06-18 DIAGNOSIS — I10 Essential (primary) hypertension: Secondary | ICD-10-CM | POA: Diagnosis not present

## 2016-06-18 DIAGNOSIS — E7800 Pure hypercholesterolemia, unspecified: Secondary | ICD-10-CM | POA: Diagnosis not present

## 2016-06-22 DIAGNOSIS — M25511 Pain in right shoulder: Secondary | ICD-10-CM | POA: Diagnosis not present

## 2016-06-22 DIAGNOSIS — M7551 Bursitis of right shoulder: Secondary | ICD-10-CM | POA: Diagnosis not present

## 2016-08-03 DIAGNOSIS — M5416 Radiculopathy, lumbar region: Secondary | ICD-10-CM | POA: Diagnosis not present

## 2016-08-03 DIAGNOSIS — M4726 Other spondylosis with radiculopathy, lumbar region: Secondary | ICD-10-CM | POA: Diagnosis not present

## 2016-08-10 DIAGNOSIS — M5416 Radiculopathy, lumbar region: Secondary | ICD-10-CM | POA: Diagnosis not present

## 2016-08-10 DIAGNOSIS — M47816 Spondylosis without myelopathy or radiculopathy, lumbar region: Secondary | ICD-10-CM | POA: Diagnosis not present

## 2016-08-10 DIAGNOSIS — M5126 Other intervertebral disc displacement, lumbar region: Secondary | ICD-10-CM | POA: Diagnosis not present

## 2016-08-10 DIAGNOSIS — Z9889 Other specified postprocedural states: Secondary | ICD-10-CM | POA: Diagnosis not present

## 2016-08-10 DIAGNOSIS — M47817 Spondylosis without myelopathy or radiculopathy, lumbosacral region: Secondary | ICD-10-CM | POA: Diagnosis not present

## 2016-08-10 DIAGNOSIS — M48061 Spinal stenosis, lumbar region without neurogenic claudication: Secondary | ICD-10-CM | POA: Diagnosis not present

## 2016-09-01 DIAGNOSIS — M48061 Spinal stenosis, lumbar region without neurogenic claudication: Secondary | ICD-10-CM | POA: Diagnosis not present

## 2016-09-09 DIAGNOSIS — M48061 Spinal stenosis, lumbar region without neurogenic claudication: Secondary | ICD-10-CM | POA: Diagnosis not present

## 2016-09-09 DIAGNOSIS — M5416 Radiculopathy, lumbar region: Secondary | ICD-10-CM | POA: Diagnosis not present

## 2016-09-17 DIAGNOSIS — C8590 Non-Hodgkin lymphoma, unspecified, unspecified site: Secondary | ICD-10-CM | POA: Diagnosis not present

## 2016-09-17 DIAGNOSIS — E7800 Pure hypercholesterolemia, unspecified: Secondary | ICD-10-CM | POA: Diagnosis not present

## 2016-09-17 DIAGNOSIS — I2510 Atherosclerotic heart disease of native coronary artery without angina pectoris: Secondary | ICD-10-CM | POA: Diagnosis not present

## 2016-09-17 DIAGNOSIS — I10 Essential (primary) hypertension: Secondary | ICD-10-CM | POA: Diagnosis not present

## 2016-09-17 DIAGNOSIS — Z299 Encounter for prophylactic measures, unspecified: Secondary | ICD-10-CM | POA: Diagnosis not present

## 2016-09-17 DIAGNOSIS — E559 Vitamin D deficiency, unspecified: Secondary | ICD-10-CM | POA: Diagnosis not present

## 2016-09-17 DIAGNOSIS — G43909 Migraine, unspecified, not intractable, without status migrainosus: Secondary | ICD-10-CM | POA: Diagnosis not present

## 2016-09-17 DIAGNOSIS — E039 Hypothyroidism, unspecified: Secondary | ICD-10-CM | POA: Diagnosis not present

## 2016-09-17 DIAGNOSIS — Z6836 Body mass index (BMI) 36.0-36.9, adult: Secondary | ICD-10-CM | POA: Diagnosis not present

## 2016-09-17 DIAGNOSIS — R569 Unspecified convulsions: Secondary | ICD-10-CM | POA: Diagnosis not present

## 2016-09-17 DIAGNOSIS — K589 Irritable bowel syndrome without diarrhea: Secondary | ICD-10-CM | POA: Diagnosis not present

## 2016-09-17 DIAGNOSIS — G5603 Carpal tunnel syndrome, bilateral upper limbs: Secondary | ICD-10-CM | POA: Diagnosis not present

## 2016-09-24 ENCOUNTER — Other Ambulatory Visit: Payer: Self-pay | Admitting: Cardiology

## 2016-10-14 DIAGNOSIS — M545 Low back pain: Secondary | ICD-10-CM | POA: Diagnosis not present

## 2016-11-03 ENCOUNTER — Other Ambulatory Visit: Payer: Self-pay | Admitting: Cardiology

## 2016-11-03 DIAGNOSIS — Z23 Encounter for immunization: Secondary | ICD-10-CM | POA: Diagnosis not present

## 2016-11-03 MED ORDER — METOPROLOL SUCCINATE ER 25 MG PO TB24: ORAL_TABLET | ORAL | 0 refills | Status: DC

## 2016-11-03 NOTE — Telephone Encounter (Signed)
° ° ° °  1. Which medications need to be refilled? (please list name of each medication and dose if known)  metoprolol succinate (TOPROL-XL) 25 MG 24 hr tablet [[id number]]    2. Which pharmacy/location (including street and city if local pharmacy) is medication to be sent to? [place], [place]   3. Do they need a 30 day or 90 day supply?  Appointment is being made for patient.

## 2016-11-03 NOTE — Telephone Encounter (Signed)
Done

## 2016-11-16 ENCOUNTER — Other Ambulatory Visit: Payer: Self-pay

## 2016-11-16 ENCOUNTER — Encounter: Payer: Self-pay | Admitting: *Deleted

## 2016-11-16 ENCOUNTER — Encounter: Payer: Self-pay | Admitting: Cardiology

## 2016-11-16 ENCOUNTER — Ambulatory Visit (INDEPENDENT_AMBULATORY_CARE_PROVIDER_SITE_OTHER): Payer: Medicare Other | Admitting: Cardiology

## 2016-11-16 VITALS — BP 118/78 | HR 79 | Ht 67.0 in | Wt 218.0 lb

## 2016-11-16 DIAGNOSIS — R0789 Other chest pain: Secondary | ICD-10-CM

## 2016-11-16 DIAGNOSIS — R002 Palpitations: Secondary | ICD-10-CM

## 2016-11-16 DIAGNOSIS — I10 Essential (primary) hypertension: Secondary | ICD-10-CM | POA: Diagnosis not present

## 2016-11-16 MED ORDER — AMLODIPINE BESYLATE 10 MG PO TABS: 10.0000 mg | ORAL_TABLET | Freq: Every day | ORAL | 1 refills | Status: AC

## 2016-11-16 NOTE — Patient Instructions (Signed)
Your physician wants you to follow-up in: 6 MONTHS WITH DR BRANCH You will receive a reminder letter in the mail two months in advance. If you don't receive a letter, please call our office to schedule the follow-up appointment.  Your physician has recommended you make the following change in your medication:   INCREASE AMLODIPINE 10 MG DAILY  Thank you for choosing [facility]!!     

## 2016-11-16 NOTE — Progress Notes (Signed)
Clinical Summary Jamie Hurst is a 71 y.o.female seen today for follow up of the following medical problems.   1. Chest pain - cath 06/14/13 showed patent coronaries, there was catheter induced spasm in the LAD. LVEF 60%. Treated medically for possible spasm induced chest pain.   - recent increase stress - increased pain since last visit. - compliant with meds - significant high bps at home she related to increased stress    2. Palpitations - previous episodes of palpitations. It was noted that she had a very high caffeine intake, drinking multiple cups of coffee a day and also with a recent increase in the amount of goodies powder she was taking.  - since cutting back on caffeine symptoms have resolved.   - no recent palpitations.    3. Recent fall - 1 month ago. Mechanical fall, hit side of chest on night stand.  - ongoing pain in that area. Still with some discomfort at times   Past Medical History:  Diagnosis Date  . Allergic rhinitis   . 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 Anxiety   . CAD (coronary artery disease)   . Depression   . GERD (gastroesophageal reflux disease)   . HTN (hypertension) 06/13/2013  . Hypertension   . Hypothyroidism   . IBS (irritable bowel syndrome)   . Non Hodgkin's lymphoma ([place])   . Seizures ([place])    stress related hx of 1 seizure in 2012, last seizure 6 yrs ago  . Unspecified essential hypertension      Not on File   Current Outpatient Medications  Medication Sig Dispense Refill  . aspirin EC 81 MG EC tablet Take 1 tablet (81 mg total) by mouth daily.    . diphenoxylate-atropine (LOMOTIL) 2.5-0.025 MG per tablet Take 2 tablets by mouth 3 (three) times daily as needed for diarrhea or loose stools.     Marland Kitchen FLUoxetine (PROZAC) 40 MG capsule Take 40 mg by mouth daily.    Marland Kitchen levETIRAcetam (KEPPRA) 500 MG tablet Take 1 tablet by mouth 2 (two) times daily.    Marland Kitchen levothyroxine (SYNTHROID, LEVOTHROID) 100 MCG tablet Take 1  tablet by mouth daily.    . metoprolol succinate (TOPROL-XL) 25 MG 24 hr tablet TAKE 1 TABLET(25 MG) BY MOUTH DAILY 15 tablet 0  . oxyCODONE-acetaminophen (PERCOCET) 5-325 MG tablet 1-2 tabs po q6 hours prn pain 30 tablet 0  . temazepam (RESTORIL) 15 MG capsule Take 15 mg by mouth at bedtime as needed for sleep.    Marland Kitchen topiramate (TOPAMAX) 50 MG tablet Take 1 tablet by mouth 2 (two) times daily as needed.     No current facility-administered medications for this visit.      Past Surgical History:  Procedure Laterality Date  . ABDOMINAL HYSTERECTOMY  1977  . LAMINECTOMY  2013   x's 2  1st 2003 and 2nd 2013  . THORACOTOMY  51   age 50  . WRIST CAPSULOTOMY       Not on File    No family history on file.   Social History Ms. Defino reports that  has never smoked. she has never used smokeless tobacco. Ms. Fiorillo reports that she does not drink alcohol.   Review of Systems CONSTITUTIONAL: No weight loss, fever, chills, weakness or fatigue.  HEENT: Eyes: No visual loss, blurred vision, double vision or yellow sclerae.No hearing loss, sneezing, congestion, runny nose or sore throat.  SKIN: No rash or itching.  CARDIOVASCULAR:  per hpi RESPIRATORY: No shortness of breath, cough or sputum.  GASTROINTESTINAL: No anorexia, nausea, vomiting or diarrhea. No abdominal pain or blood.  GENITOURINARY: No burning on urination, no polyuria NEUROLOGICAL: No headache, dizziness, syncope, paralysis, ataxia, numbness or tingling in the extremities. No change in bowel or bladder control.  MUSCULOSKELETAL: +chest wall pain.  LYMPHATICS: No enlarged nodes. No history of splenectomy.  PSYCHIATRIC: No history of depression or anxiety.  ENDOCRINOLOGIC: No reports of sweating, cold or heat intolerance. No polyuria or polydipsia.  Marland Kitchen   Physical Examination Vitals:   11/16/16 0839  BP: 118/78  Pulse: 79  SpO2: 97%   Vitals:   11/16/16 0839  Weight: 218 lb (98.9 kg)  Height: 5\' 7"   (1.702 m)    Gen: resting comfortably, no acute distress HEENT: no scleral icterus, pupils equal round and reactive, no palptable cervical adenopathy,  CV: RRR, no m/r/,g no jvd Resp: Clear to auscultation bilaterally GI: abdomen is soft, non-tender, non-distended, normal bowel sounds, no hepatosplenomegaly MSK: extremities are warm, no edema.  Skin: warm, no rash Neuro:  no focal deficits Psych: appropriate affect   Diagnostic Studies 06/14/13 Cath ANGIOGRAPHIC DATA: The left main coronary artery is absent.  The left anterior descending artery is a large vessel which reaches the apex. There is a large diagonal vessel which is widely patent. There was catheter-induced spasm in the proximal LAD. This improved with intracoronary nitroglycerin.  The left circumflex artery is a large vessel. There is a large first obtuse marginal which is widely patent. The circumflex system appears angiographically normal.  The right coronary artery is a large vessel. There is a large posterior descending artery. The posterior lateral artery is large as well. The RCA system appears angiographically normal.  LEFT VENTRICULOGRAM: Left ventricular angiogram was done in the 30 RAO projection and revealed normal left ventricular wall motion and systolic function with an estimated ejection fraction of 60 %. LVEDP was 10 mmHg.  ASCENDING AORTOGRAM: There is no evidence of aneurysm. The innominate artery fills and contrast is seen in the carotid. There appears to be some late filling of contrast in the proximal subclavian.  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 2. Widely patent left anterior descending artery and its branches. Catheter-induced spasm noted in the proximal LAD which improved with IC nitroglycerin. 3. Normal left circumflex artery and its branches. 4. Normal right coronary artery. 5. Normal left ventricular systolic  function. LVEDP 10 mmHg. Ejection fraction 60%. 6. Occluded native right subclavian artery. Of note, she had a vascular surgery procedure when she was a teenager where she states a vessel had to be clipped. 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  RECOMMENDATION: Continue aggressive medical therapy and risk factor modification. I would put her on low-dose amlodipine to help with potential vasospasm. She will be watched overnight. Followup with Dr. Harl Bowie.    Assessment and Plan  1. Chest pain - negative cath 06/2013, question of possible spasm induced chest pain.  - recent chest wall pain, no specific cardiac pain - continue to monitor  2. Palpitations - symptoms resolved with cut back in caffeine intake and restarting her Toprol XL - continue current meds  3. HTN - bp above goal, increase norvasc to 10mg  daily.       Arnoldo Lenis, M.D.

## 2016-11-17 ENCOUNTER — Other Ambulatory Visit: Payer: Self-pay | Admitting: Cardiology

## 2016-11-19 ENCOUNTER — Encounter: Payer: Self-pay | Admitting: Cardiology

## 2016-12-17 DIAGNOSIS — M5416 Radiculopathy, lumbar region: Secondary | ICD-10-CM | POA: Diagnosis not present

## 2017-01-20 DIAGNOSIS — I10 Essential (primary) hypertension: Secondary | ICD-10-CM | POA: Diagnosis not present

## 2017-01-20 DIAGNOSIS — Z299 Encounter for prophylactic measures, unspecified: Secondary | ICD-10-CM | POA: Diagnosis not present

## 2017-01-20 DIAGNOSIS — Z789 Other specified health status: Secondary | ICD-10-CM | POA: Diagnosis not present

## 2017-01-20 DIAGNOSIS — R197 Diarrhea, unspecified: Secondary | ICD-10-CM | POA: Diagnosis not present

## 2017-01-20 DIAGNOSIS — E039 Hypothyroidism, unspecified: Secondary | ICD-10-CM | POA: Diagnosis not present

## 2017-01-20 DIAGNOSIS — Z6836 Body mass index (BMI) 36.0-36.9, adult: Secondary | ICD-10-CM | POA: Diagnosis not present

## 2017-01-20 DIAGNOSIS — C8590 Non-Hodgkin lymphoma, unspecified, unspecified site: Secondary | ICD-10-CM | POA: Diagnosis not present

## 2017-02-02 DIAGNOSIS — M5416 Radiculopathy, lumbar region: Secondary | ICD-10-CM | POA: Diagnosis not present

## 2017-02-02 DIAGNOSIS — M545 Low back pain: Secondary | ICD-10-CM | POA: Diagnosis not present

## 2017-02-02 DIAGNOSIS — M5410 Radiculopathy, site unspecified: Secondary | ICD-10-CM | POA: Diagnosis not present

## 2017-04-12 DIAGNOSIS — H2513 Age-related nuclear cataract, bilateral: Secondary | ICD-10-CM | POA: Diagnosis not present

## 2017-04-12 DIAGNOSIS — H353131 Nonexudative age-related macular degeneration, bilateral, early dry stage: Secondary | ICD-10-CM | POA: Diagnosis not present

## 2017-04-12 DIAGNOSIS — H524 Presbyopia: Secondary | ICD-10-CM | POA: Diagnosis not present

## 2017-04-20 DIAGNOSIS — Z6836 Body mass index (BMI) 36.0-36.9, adult: Secondary | ICD-10-CM | POA: Diagnosis not present

## 2017-04-20 DIAGNOSIS — E039 Hypothyroidism, unspecified: Secondary | ICD-10-CM | POA: Diagnosis not present

## 2017-04-20 DIAGNOSIS — G4700 Insomnia, unspecified: Secondary | ICD-10-CM | POA: Diagnosis not present

## 2017-04-20 DIAGNOSIS — R569 Unspecified convulsions: Secondary | ICD-10-CM | POA: Diagnosis not present

## 2017-04-20 DIAGNOSIS — C8590 Non-Hodgkin lymphoma, unspecified, unspecified site: Secondary | ICD-10-CM | POA: Diagnosis not present

## 2017-04-20 DIAGNOSIS — M545 Low back pain: Secondary | ICD-10-CM | POA: Diagnosis not present

## 2017-04-20 DIAGNOSIS — G43909 Migraine, unspecified, not intractable, without status migrainosus: Secondary | ICD-10-CM | POA: Diagnosis not present

## 2017-04-20 DIAGNOSIS — I10 Essential (primary) hypertension: Secondary | ICD-10-CM | POA: Diagnosis not present

## 2017-04-20 DIAGNOSIS — Z299 Encounter for prophylactic measures, unspecified: Secondary | ICD-10-CM | POA: Diagnosis not present

## 2017-04-20 DIAGNOSIS — K219 Gastro-esophageal reflux disease without esophagitis: Secondary | ICD-10-CM | POA: Diagnosis not present

## 2017-04-20 DIAGNOSIS — E559 Vitamin D deficiency, unspecified: Secondary | ICD-10-CM | POA: Diagnosis not present

## 2017-05-11 DIAGNOSIS — S86912A Strain of unspecified muscle(s) and tendon(s) at lower leg level, left leg, initial encounter: Secondary | ICD-10-CM | POA: Diagnosis not present

## 2017-06-22 DIAGNOSIS — S86912A Strain of unspecified muscle(s) and tendon(s) at lower leg level, left leg, initial encounter: Secondary | ICD-10-CM | POA: Diagnosis not present

## 2017-06-22 DIAGNOSIS — M1712 Unilateral primary osteoarthritis, left knee: Secondary | ICD-10-CM | POA: Diagnosis not present

## 2017-06-22 DIAGNOSIS — M7122 Synovial cyst of popliteal space [Baker], left knee: Secondary | ICD-10-CM | POA: Diagnosis not present

## 2017-07-12 DIAGNOSIS — S83282D Other tear of lateral meniscus, current injury, left knee, subsequent encounter: Secondary | ICD-10-CM | POA: Diagnosis not present

## 2017-07-12 DIAGNOSIS — M1712 Unilateral primary osteoarthritis, left knee: Secondary | ICD-10-CM | POA: Diagnosis not present

## 2017-07-22 DIAGNOSIS — M25562 Pain in left knee: Secondary | ICD-10-CM | POA: Diagnosis not present

## 2017-07-22 DIAGNOSIS — M1712 Unilateral primary osteoarthritis, left knee: Secondary | ICD-10-CM | POA: Diagnosis not present

## 2017-07-26 DIAGNOSIS — S83282D Other tear of lateral meniscus, current injury, left knee, subsequent encounter: Secondary | ICD-10-CM | POA: Diagnosis not present

## 2017-08-02 DIAGNOSIS — M1712 Unilateral primary osteoarthritis, left knee: Secondary | ICD-10-CM | POA: Diagnosis not present

## 2017-08-04 DIAGNOSIS — Z6836 Body mass index (BMI) 36.0-36.9, adult: Secondary | ICD-10-CM | POA: Diagnosis not present

## 2017-08-04 DIAGNOSIS — M1712 Unilateral primary osteoarthritis, left knee: Secondary | ICD-10-CM | POA: Diagnosis not present

## 2017-08-04 DIAGNOSIS — K219 Gastro-esophageal reflux disease without esophagitis: Secondary | ICD-10-CM | POA: Diagnosis not present

## 2017-08-04 DIAGNOSIS — Z299 Encounter for prophylactic measures, unspecified: Secondary | ICD-10-CM | POA: Diagnosis not present

## 2017-08-04 DIAGNOSIS — G4700 Insomnia, unspecified: Secondary | ICD-10-CM | POA: Diagnosis not present

## 2017-08-04 DIAGNOSIS — I10 Essential (primary) hypertension: Secondary | ICD-10-CM | POA: Diagnosis not present

## 2017-08-18 DIAGNOSIS — M545 Low back pain: Secondary | ICD-10-CM | POA: Diagnosis not present

## 2017-08-18 DIAGNOSIS — M5410 Radiculopathy, site unspecified: Secondary | ICD-10-CM | POA: Diagnosis not present

## 2017-08-18 DIAGNOSIS — M5416 Radiculopathy, lumbar region: Secondary | ICD-10-CM | POA: Diagnosis not present

## 2017-10-21 DIAGNOSIS — I672 Cerebral atherosclerosis: Secondary | ICD-10-CM | POA: Diagnosis not present

## 2017-10-21 DIAGNOSIS — R51 Headache: Secondary | ICD-10-CM | POA: Diagnosis not present

## 2017-10-21 DIAGNOSIS — S0990XA Unspecified injury of head, initial encounter: Secondary | ICD-10-CM | POA: Diagnosis not present

## 2017-10-21 DIAGNOSIS — I10 Essential (primary) hypertension: Secondary | ICD-10-CM | POA: Diagnosis not present

## 2017-10-21 DIAGNOSIS — Z299 Encounter for prophylactic measures, unspecified: Secondary | ICD-10-CM | POA: Diagnosis not present

## 2017-10-21 DIAGNOSIS — R55 Syncope and collapse: Secondary | ICD-10-CM | POA: Diagnosis not present

## 2017-10-21 DIAGNOSIS — W19XXXA Unspecified fall, initial encounter: Secondary | ICD-10-CM | POA: Diagnosis not present

## 2017-10-21 DIAGNOSIS — Z6836 Body mass index (BMI) 36.0-36.9, adult: Secondary | ICD-10-CM | POA: Diagnosis not present

## 2017-10-21 DIAGNOSIS — Z23 Encounter for immunization: Secondary | ICD-10-CM | POA: Diagnosis not present

## 2017-10-21 DIAGNOSIS — E039 Hypothyroidism, unspecified: Secondary | ICD-10-CM | POA: Diagnosis not present

## 2017-10-25 DIAGNOSIS — Z6836 Body mass index (BMI) 36.0-36.9, adult: Secondary | ICD-10-CM | POA: Diagnosis not present

## 2017-10-25 DIAGNOSIS — E039 Hypothyroidism, unspecified: Secondary | ICD-10-CM | POA: Diagnosis not present

## 2017-10-25 DIAGNOSIS — R51 Headache: Secondary | ICD-10-CM | POA: Diagnosis not present

## 2017-10-25 DIAGNOSIS — I10 Essential (primary) hypertension: Secondary | ICD-10-CM | POA: Diagnosis not present

## 2017-10-25 DIAGNOSIS — C8590 Non-Hodgkin lymphoma, unspecified, unspecified site: Secondary | ICD-10-CM | POA: Diagnosis not present

## 2017-10-25 DIAGNOSIS — Z299 Encounter for prophylactic measures, unspecified: Secondary | ICD-10-CM | POA: Diagnosis not present

## 2018-01-04 DIAGNOSIS — M1712 Unilateral primary osteoarthritis, left knee: Secondary | ICD-10-CM | POA: Diagnosis not present

## 2018-01-04 DIAGNOSIS — Z6832 Body mass index (BMI) 32.0-32.9, adult: Secondary | ICD-10-CM | POA: Diagnosis not present

## 2018-01-25 DIAGNOSIS — M1712 Unilateral primary osteoarthritis, left knee: Secondary | ICD-10-CM | POA: Diagnosis not present

## 2018-02-01 DIAGNOSIS — Z6836 Body mass index (BMI) 36.0-36.9, adult: Secondary | ICD-10-CM | POA: Diagnosis not present

## 2018-02-01 DIAGNOSIS — G4700 Insomnia, unspecified: Secondary | ICD-10-CM | POA: Diagnosis not present

## 2018-02-01 DIAGNOSIS — I10 Essential (primary) hypertension: Secondary | ICD-10-CM | POA: Diagnosis not present

## 2018-02-01 DIAGNOSIS — E7800 Pure hypercholesterolemia, unspecified: Secondary | ICD-10-CM | POA: Diagnosis not present

## 2018-02-01 DIAGNOSIS — R569 Unspecified convulsions: Secondary | ICD-10-CM | POA: Diagnosis not present

## 2018-02-01 DIAGNOSIS — Z299 Encounter for prophylactic measures, unspecified: Secondary | ICD-10-CM | POA: Diagnosis not present

## 2018-02-14 DIAGNOSIS — M5416 Radiculopathy, lumbar region: Secondary | ICD-10-CM | POA: Diagnosis not present

## 2018-02-21 DIAGNOSIS — S83282D Other tear of lateral meniscus, current injury, left knee, subsequent encounter: Secondary | ICD-10-CM | POA: Diagnosis not present

## 2018-02-21 DIAGNOSIS — M25562 Pain in left knee: Secondary | ICD-10-CM | POA: Diagnosis not present

## 2018-02-21 DIAGNOSIS — Z6832 Body mass index (BMI) 32.0-32.9, adult: Secondary | ICD-10-CM | POA: Diagnosis not present

## 2018-02-23 DIAGNOSIS — S83282D Other tear of lateral meniscus, current injury, left knee, subsequent encounter: Secondary | ICD-10-CM | POA: Diagnosis not present

## 2018-02-23 DIAGNOSIS — M25562 Pain in left knee: Secondary | ICD-10-CM | POA: Diagnosis not present

## 2018-03-06 HISTORY — PX: KNEE ARTHROSCOPY WITH LATERAL MENISECTOMY: SHX6193

## 2018-03-07 DIAGNOSIS — S83272D Complex tear of lateral meniscus, current injury, left knee, subsequent encounter: Secondary | ICD-10-CM | POA: Diagnosis not present

## 2018-03-07 DIAGNOSIS — M5416 Radiculopathy, lumbar region: Secondary | ICD-10-CM | POA: Diagnosis not present

## 2018-03-07 DIAGNOSIS — Z6832 Body mass index (BMI) 32.0-32.9, adult: Secondary | ICD-10-CM | POA: Diagnosis not present

## 2018-03-07 DIAGNOSIS — M545 Low back pain: Secondary | ICD-10-CM | POA: Diagnosis not present

## 2018-03-09 DIAGNOSIS — R569 Unspecified convulsions: Secondary | ICD-10-CM | POA: Diagnosis not present

## 2018-03-09 DIAGNOSIS — I10 Essential (primary) hypertension: Secondary | ICD-10-CM | POA: Diagnosis not present

## 2018-03-09 DIAGNOSIS — F419 Anxiety disorder, unspecified: Secondary | ICD-10-CM | POA: Diagnosis not present

## 2018-03-09 DIAGNOSIS — F329 Major depressive disorder, single episode, unspecified: Secondary | ICD-10-CM | POA: Diagnosis not present

## 2018-03-09 DIAGNOSIS — M1712 Unilateral primary osteoarthritis, left knee: Secondary | ICD-10-CM | POA: Diagnosis not present

## 2018-03-09 DIAGNOSIS — E039 Hypothyroidism, unspecified: Secondary | ICD-10-CM | POA: Diagnosis not present

## 2018-03-09 DIAGNOSIS — K219 Gastro-esophageal reflux disease without esophagitis: Secondary | ICD-10-CM | POA: Diagnosis not present

## 2018-03-09 DIAGNOSIS — Z8572 Personal history of non-Hodgkin lymphomas: Secondary | ICD-10-CM | POA: Diagnosis not present

## 2018-03-09 DIAGNOSIS — S83242A Other tear of medial meniscus, current injury, left knee, initial encounter: Secondary | ICD-10-CM | POA: Diagnosis not present

## 2018-03-09 DIAGNOSIS — E785 Hyperlipidemia, unspecified: Secondary | ICD-10-CM | POA: Diagnosis not present

## 2018-03-09 DIAGNOSIS — Z6836 Body mass index (BMI) 36.0-36.9, adult: Secondary | ICD-10-CM | POA: Diagnosis not present

## 2018-03-09 DIAGNOSIS — S83282A Other tear of lateral meniscus, current injury, left knee, initial encounter: Secondary | ICD-10-CM | POA: Diagnosis not present

## 2018-03-09 DIAGNOSIS — C8590 Non-Hodgkin lymphoma, unspecified, unspecified site: Secondary | ICD-10-CM | POA: Diagnosis not present

## 2018-03-09 DIAGNOSIS — Z299 Encounter for prophylactic measures, unspecified: Secondary | ICD-10-CM | POA: Diagnosis not present

## 2018-03-11 DIAGNOSIS — S83282A Other tear of lateral meniscus, current injury, left knee, initial encounter: Secondary | ICD-10-CM | POA: Diagnosis not present

## 2018-03-11 DIAGNOSIS — F329 Major depressive disorder, single episode, unspecified: Secondary | ICD-10-CM | POA: Diagnosis not present

## 2018-03-11 DIAGNOSIS — F419 Anxiety disorder, unspecified: Secondary | ICD-10-CM | POA: Diagnosis not present

## 2018-03-11 DIAGNOSIS — K219 Gastro-esophageal reflux disease without esophagitis: Secondary | ICD-10-CM | POA: Diagnosis not present

## 2018-03-11 DIAGNOSIS — M1712 Unilateral primary osteoarthritis, left knee: Secondary | ICD-10-CM | POA: Diagnosis not present

## 2018-03-11 DIAGNOSIS — S83242A Other tear of medial meniscus, current injury, left knee, initial encounter: Secondary | ICD-10-CM | POA: Diagnosis not present

## 2018-04-04 DIAGNOSIS — M25462 Effusion, left knee: Secondary | ICD-10-CM | POA: Diagnosis not present

## 2018-04-04 DIAGNOSIS — L03116 Cellulitis of left lower limb: Secondary | ICD-10-CM | POA: Diagnosis not present

## 2018-04-18 DIAGNOSIS — M1712 Unilateral primary osteoarthritis, left knee: Secondary | ICD-10-CM | POA: Diagnosis not present

## 2018-04-18 DIAGNOSIS — S83272D Complex tear of lateral meniscus, current injury, left knee, subsequent encounter: Secondary | ICD-10-CM | POA: Diagnosis not present

## 2018-05-02 DIAGNOSIS — M545 Low back pain: Secondary | ICD-10-CM | POA: Diagnosis not present

## 2018-05-02 DIAGNOSIS — M5136 Other intervertebral disc degeneration, lumbar region: Secondary | ICD-10-CM | POA: Diagnosis not present

## 2018-05-02 DIAGNOSIS — I10 Essential (primary) hypertension: Secondary | ICD-10-CM | POA: Diagnosis not present

## 2018-05-02 DIAGNOSIS — M25562 Pain in left knee: Secondary | ICD-10-CM | POA: Diagnosis not present

## 2018-05-02 DIAGNOSIS — M1712 Unilateral primary osteoarthritis, left knee: Secondary | ICD-10-CM | POA: Diagnosis not present

## 2018-05-02 DIAGNOSIS — M5410 Radiculopathy, site unspecified: Secondary | ICD-10-CM | POA: Diagnosis not present

## 2018-05-05 DIAGNOSIS — Z6836 Body mass index (BMI) 36.0-36.9, adult: Secondary | ICD-10-CM | POA: Diagnosis not present

## 2018-05-05 DIAGNOSIS — I10 Essential (primary) hypertension: Secondary | ICD-10-CM | POA: Diagnosis not present

## 2018-05-05 DIAGNOSIS — M549 Dorsalgia, unspecified: Secondary | ICD-10-CM | POA: Diagnosis not present

## 2018-05-05 DIAGNOSIS — C8590 Non-Hodgkin lymphoma, unspecified, unspecified site: Secondary | ICD-10-CM | POA: Diagnosis not present

## 2018-05-05 DIAGNOSIS — Z299 Encounter for prophylactic measures, unspecified: Secondary | ICD-10-CM | POA: Diagnosis not present

## 2018-05-05 DIAGNOSIS — I2510 Atherosclerotic heart disease of native coronary artery without angina pectoris: Secondary | ICD-10-CM | POA: Diagnosis not present

## 2018-05-13 DIAGNOSIS — M25562 Pain in left knee: Secondary | ICD-10-CM | POA: Diagnosis not present

## 2018-05-13 DIAGNOSIS — G8929 Other chronic pain: Secondary | ICD-10-CM | POA: Diagnosis not present

## 2018-05-13 DIAGNOSIS — S83242A Other tear of medial meniscus, current injury, left knee, initial encounter: Secondary | ICD-10-CM | POA: Diagnosis not present

## 2018-05-13 DIAGNOSIS — S83282A Other tear of lateral meniscus, current injury, left knee, initial encounter: Secondary | ICD-10-CM | POA: Diagnosis not present

## 2018-05-19 DIAGNOSIS — M79605 Pain in left leg: Secondary | ICD-10-CM | POA: Diagnosis not present

## 2018-05-19 DIAGNOSIS — R202 Paresthesia of skin: Secondary | ICD-10-CM | POA: Diagnosis not present

## 2018-05-26 DIAGNOSIS — M79605 Pain in left leg: Secondary | ICD-10-CM | POA: Diagnosis not present

## 2018-07-05 DIAGNOSIS — M25562 Pain in left knee: Secondary | ICD-10-CM | POA: Diagnosis not present

## 2018-07-28 DIAGNOSIS — M5410 Radiculopathy, site unspecified: Secondary | ICD-10-CM | POA: Diagnosis not present

## 2018-07-28 DIAGNOSIS — M79605 Pain in left leg: Secondary | ICD-10-CM | POA: Diagnosis not present

## 2018-07-28 DIAGNOSIS — M545 Low back pain: Secondary | ICD-10-CM | POA: Diagnosis not present

## 2018-07-28 DIAGNOSIS — Z6832 Body mass index (BMI) 32.0-32.9, adult: Secondary | ICD-10-CM | POA: Diagnosis not present

## 2018-07-29 ENCOUNTER — Other Ambulatory Visit: Payer: Medicare Other

## 2018-07-29 DIAGNOSIS — Z20822 Contact with and (suspected) exposure to covid-19: Secondary | ICD-10-CM

## 2018-07-29 DIAGNOSIS — R6889 Other general symptoms and signs: Secondary | ICD-10-CM | POA: Diagnosis not present

## 2018-08-01 LAB — NOVEL CORONAVIRUS, NAA: SARS-CoV-2, NAA: NOT DETECTED

## 2018-08-10 DIAGNOSIS — M47816 Spondylosis without myelopathy or radiculopathy, lumbar region: Secondary | ICD-10-CM | POA: Diagnosis not present

## 2018-08-10 DIAGNOSIS — M5136 Other intervertebral disc degeneration, lumbar region: Secondary | ICD-10-CM | POA: Diagnosis not present

## 2018-08-10 DIAGNOSIS — M545 Low back pain: Secondary | ICD-10-CM | POA: Diagnosis not present

## 2018-08-10 DIAGNOSIS — M1712 Unilateral primary osteoarthritis, left knee: Secondary | ICD-10-CM | POA: Diagnosis not present

## 2018-08-10 DIAGNOSIS — G894 Chronic pain syndrome: Secondary | ICD-10-CM | POA: Diagnosis not present

## 2018-08-10 DIAGNOSIS — M4726 Other spondylosis with radiculopathy, lumbar region: Secondary | ICD-10-CM | POA: Diagnosis not present

## 2018-08-10 DIAGNOSIS — M48061 Spinal stenosis, lumbar region without neurogenic claudication: Secondary | ICD-10-CM | POA: Diagnosis not present

## 2018-08-10 DIAGNOSIS — Z79891 Long term (current) use of opiate analgesic: Secondary | ICD-10-CM | POA: Diagnosis not present

## 2018-08-23 DIAGNOSIS — M5136 Other intervertebral disc degeneration, lumbar region: Secondary | ICD-10-CM | POA: Diagnosis not present

## 2018-08-23 DIAGNOSIS — M48061 Spinal stenosis, lumbar region without neurogenic claudication: Secondary | ICD-10-CM | POA: Diagnosis not present

## 2018-08-25 DIAGNOSIS — Z01812 Encounter for preprocedural laboratory examination: Secondary | ICD-10-CM | POA: Diagnosis not present

## 2018-08-25 DIAGNOSIS — M1712 Unilateral primary osteoarthritis, left knee: Secondary | ICD-10-CM | POA: Diagnosis not present

## 2018-08-25 DIAGNOSIS — Z20828 Contact with and (suspected) exposure to other viral communicable diseases: Secondary | ICD-10-CM | POA: Diagnosis not present

## 2018-08-29 DIAGNOSIS — Z20828 Contact with and (suspected) exposure to other viral communicable diseases: Secondary | ICD-10-CM | POA: Diagnosis not present

## 2018-08-29 DIAGNOSIS — Z01812 Encounter for preprocedural laboratory examination: Secondary | ICD-10-CM | POA: Diagnosis not present

## 2018-09-01 DIAGNOSIS — G4700 Insomnia, unspecified: Secondary | ICD-10-CM | POA: Diagnosis not present

## 2018-09-01 DIAGNOSIS — I10 Essential (primary) hypertension: Secondary | ICD-10-CM | POA: Diagnosis not present

## 2018-09-01 DIAGNOSIS — Z299 Encounter for prophylactic measures, unspecified: Secondary | ICD-10-CM | POA: Diagnosis not present

## 2018-09-01 DIAGNOSIS — M549 Dorsalgia, unspecified: Secondary | ICD-10-CM | POA: Diagnosis not present

## 2018-09-01 DIAGNOSIS — Z6836 Body mass index (BMI) 36.0-36.9, adult: Secondary | ICD-10-CM | POA: Diagnosis not present

## 2018-09-02 DIAGNOSIS — G8929 Other chronic pain: Secondary | ICD-10-CM | POA: Diagnosis not present

## 2018-09-02 DIAGNOSIS — Z7982 Long term (current) use of aspirin: Secondary | ICD-10-CM | POA: Diagnosis not present

## 2018-09-02 DIAGNOSIS — E039 Hypothyroidism, unspecified: Secondary | ICD-10-CM | POA: Diagnosis not present

## 2018-09-02 DIAGNOSIS — E669 Obesity, unspecified: Secondary | ICD-10-CM | POA: Diagnosis not present

## 2018-09-02 DIAGNOSIS — Z6831 Body mass index (BMI) 31.0-31.9, adult: Secondary | ICD-10-CM | POA: Diagnosis not present

## 2018-09-02 DIAGNOSIS — I10 Essential (primary) hypertension: Secondary | ICD-10-CM | POA: Diagnosis not present

## 2018-09-02 DIAGNOSIS — Z79899 Other long term (current) drug therapy: Secondary | ICD-10-CM | POA: Diagnosis not present

## 2018-09-02 DIAGNOSIS — M1990 Unspecified osteoarthritis, unspecified site: Secondary | ICD-10-CM | POA: Diagnosis not present

## 2018-09-02 DIAGNOSIS — F329 Major depressive disorder, single episode, unspecified: Secondary | ICD-10-CM | POA: Diagnosis not present

## 2018-09-02 DIAGNOSIS — M8008XA Age-related osteoporosis with current pathological fracture, vertebra(e), initial encounter for fracture: Secondary | ICD-10-CM | POA: Diagnosis not present

## 2018-09-02 DIAGNOSIS — M4726 Other spondylosis with radiculopathy, lumbar region: Secondary | ICD-10-CM | POA: Diagnosis not present

## 2018-09-02 DIAGNOSIS — M4854XA Collapsed vertebra, not elsewhere classified, thoracic region, initial encounter for fracture: Secondary | ICD-10-CM | POA: Diagnosis not present

## 2018-09-02 DIAGNOSIS — M48061 Spinal stenosis, lumbar region without neurogenic claudication: Secondary | ICD-10-CM | POA: Diagnosis not present

## 2018-09-02 DIAGNOSIS — M5116 Intervertebral disc disorders with radiculopathy, lumbar region: Secondary | ICD-10-CM | POA: Diagnosis not present

## 2018-09-02 DIAGNOSIS — Z79891 Long term (current) use of opiate analgesic: Secondary | ICD-10-CM | POA: Diagnosis not present

## 2018-09-02 DIAGNOSIS — M1712 Unilateral primary osteoarthritis, left knee: Secondary | ICD-10-CM | POA: Diagnosis not present

## 2018-09-02 DIAGNOSIS — S22080A Wedge compression fracture of T11-T12 vertebra, initial encounter for closed fracture: Secondary | ICD-10-CM | POA: Diagnosis not present

## 2018-09-07 ENCOUNTER — Telehealth: Payer: Self-pay | Admitting: *Deleted

## 2018-09-07 DIAGNOSIS — S22080G Wedge compression fracture of T11-T12 vertebra, subsequent encounter for fracture with delayed healing: Secondary | ICD-10-CM | POA: Diagnosis not present

## 2018-09-07 DIAGNOSIS — G894 Chronic pain syndrome: Secondary | ICD-10-CM | POA: Diagnosis not present

## 2018-09-07 DIAGNOSIS — M8000XG Age-related osteoporosis with current pathological fracture, unspecified site, subsequent encounter for fracture with delayed healing: Secondary | ICD-10-CM | POA: Diagnosis not present

## 2018-09-07 DIAGNOSIS — M47816 Spondylosis without myelopathy or radiculopathy, lumbar region: Secondary | ICD-10-CM | POA: Diagnosis not present

## 2018-09-07 DIAGNOSIS — M1712 Unilateral primary osteoarthritis, left knee: Secondary | ICD-10-CM | POA: Diagnosis not present

## 2018-09-07 NOTE — Telephone Encounter (Signed)
Patient informed. Reports drinking one cup of coffee/day but has not drank any in the past 2 weeks.

## 2018-09-07 NOTE — Telephone Encounter (Signed)
Yes that's fine, previously she had a pretty high caffeine intake. Has she gone back to this? If so cutting back could make a difference   Zandra Abts MD

## 2018-09-07 NOTE — Telephone Encounter (Signed)
Patient called the office requesting an appointment to be seen due to having elevated HR at times. Reports that her HR normally range around 82-84. HR was 121 while doing pre-admission testing last week. At doctors visit today HR was 114 & BP 132/95. Reports feeling a little fluttering in her chest. Denies chest pain or sob.

## 2018-09-16 ENCOUNTER — Other Ambulatory Visit: Payer: Self-pay

## 2018-09-16 ENCOUNTER — Encounter: Payer: Self-pay | Admitting: Cardiology

## 2018-09-16 ENCOUNTER — Ambulatory Visit (INDEPENDENT_AMBULATORY_CARE_PROVIDER_SITE_OTHER): Payer: Medicare Other | Admitting: Cardiology

## 2018-09-16 ENCOUNTER — Encounter: Payer: Self-pay | Admitting: *Deleted

## 2018-09-16 VITALS — BP 129/81 | HR 77 | Ht 67.0 in | Wt 206.6 lb

## 2018-09-16 DIAGNOSIS — R0789 Other chest pain: Secondary | ICD-10-CM | POA: Diagnosis not present

## 2018-09-16 DIAGNOSIS — R002 Palpitations: Secondary | ICD-10-CM

## 2018-09-16 DIAGNOSIS — I10 Essential (primary) hypertension: Secondary | ICD-10-CM | POA: Diagnosis not present

## 2018-09-16 MED ORDER — METOPROLOL SUCCINATE ER 25 MG PO TB24: ORAL_TABLET | ORAL | 1 refills | Status: AC

## 2018-09-16 NOTE — Progress Notes (Signed)
Clinical Summary Jamie Hurst is a 73 y.o.female seen today for follow up of the following medical problems.   1. Chest pain - cath 06/14/13 showed patent coronaries, there was catheter induced spasm in the LAD. LVEF 60%. Treated medically for possible spasm induced chest pain.   - no recent chest pain    2. Palpitations - previous episodes of palpitations. It was noted that she had a very high caffeine intake, drinking multiple cups of coffee a day and also with a recent increase in the amount of goodies powder she was taking. Symptoms improved at that time with weaning caffeine  - recent issues with elevated heart rates - heart rates 120s preop for a recent surgery, felt heart racing.  - has noted some elevated rates at home over the last 2-3 months. Up to 110s-115s - feels palpitations 3-4 times per week - can occur at any time -lasts just a few seconds - she reports increased stress recently.   - limiting caffeine intake. No EtOH   3. HTN - reports bp's are up and down, though mainly in setting of recent significant pain related to recent back issues. No heavy NSAID use - compliant with meds     Past Medical History:  Diagnosis Date  . Allergic rhinitis   . 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 Anxiety   . CAD (coronary artery disease)   . Depression   . GERD (gastroesophageal reflux disease)   . HTN (hypertension) 06/13/2013  . Hypertension   . Hypothyroidism   . IBS (irritable bowel syndrome)   . Non Hodgkin's lymphoma ([place])   . Seizures ([place])    stress related hx of 1 seizure in 2012, last seizure 6 yrs ago  . Unspecified essential hypertension      No Known Allergies   Current Outpatient Medications  Medication Sig Dispense Refill  . amLODipine (NORVASC) 10 MG tablet Take 1 tablet (10 mg total) daily by mouth. 90 tablet 1  . aspirin EC 81 MG EC tablet Take 1 tablet (81 mg total) by mouth daily.    . diphenoxylate-atropine  (LOMOTIL) 2.5-0.025 MG per tablet Take 2 tablets by mouth 3 (three) times daily as needed for diarrhea or loose stools.     Marland Kitchen FLUoxetine (PROZAC) 40 MG capsule Take 40 mg by mouth daily.    Marland Kitchen levETIRAcetam (KEPPRA) 500 MG tablet Take 1 tablet by mouth 2 (two) times daily.    Marland Kitchen levothyroxine (SYNTHROID, LEVOTHROID) 100 MCG tablet Take 1 tablet by mouth daily.    . metoprolol succinate (TOPROL-XL) 25 MG 24 hr tablet TAKE 1 TABLET BY MOUTH DAILY 30 tablet 6  . temazepam (RESTORIL) 15 MG capsule Take 15 mg by mouth at bedtime as needed for sleep.    Marland Kitchen topiramate (TOPAMAX) 50 MG tablet Take 1 tablet by mouth 2 (two) times daily as needed.     No current facility-administered medications for this visit.      Past Surgical History:  Procedure Laterality Date  . ABDOMINAL HYSTERECTOMY  1977  . LAMINECTOMY  2013   x's 2  1st 2003 and 2nd 2013  . LEFT HEART CATHETERIZATION WITH CORONARY ANGIOGRAM N/A 06/14/2013   Procedure: LEFT HEART CATHETERIZATION WITH CORONARY ANGIOGRAM;  Surgeon: Peter M Martinique, MD;  Location[facility] C[facility] LAB;  Service: Cardiovascular;  Laterality: N/A;  . OPEN REDUCTION INTERNAL FIXATION (ORIF) DISTAL RADIAL FRACTURE Right 04/18/2015   Procedure: OPEN REDUCTION INTERNAL FIXATION (ORIF) RIGHT  DISTAL RADIUS OSTEOTOMY;  Surgeon: Leanora Cover, MD;  Location: [place];  Service: Orthopedics;  Laterality: Right;  . THORACOTOMY  10   age 63  . ULNA OSTEOTOMY Right 08/27/2015   Procedure: Right wrist Darrach;  Surgeon: Leanora Cover, MD;  Location: [place] [facility];  Service: Orthopedics;  Laterality: Right;  . WRIST CAPSULOTOMY       No Known Allergies    Family History  Problem Relation Age of Onset  . Heart disease Maternal Grandfather   . Diabetes Paternal Grandfather      Social History Ms. Kinyon reports that she has never smoked. She has never used smokeless tobacco. Ms. Haverty reports no history of alcohol use.   Review of Systems  CONSTITUTIONAL: No weight loss, fever, chills, weakness or fatigue.  HEENT: Eyes: No visual loss, blurred vision, double vision or yellow sclerae.No hearing loss, sneezing, congestion, runny nose or sore throat.  SKIN: No rash or itching.  CARDIOVASCULAR: per hpi RESPIRATORY: No shortness of breath, cough or sputum.  GASTROINTESTINAL: No anorexia, nausea, vomiting or diarrhea. No abdominal pain or blood.  GENITOURINARY: No burning on urination, no polyuria NEUROLOGICAL: No headache, dizziness, syncope, paralysis, ataxia, numbness or tingling in the extremities. No change in bowel or bladder control.  MUSCULOSKELETAL: No muscle, back pain, joint pain or stiffness.  LYMPHATICS: No enlarged nodes. No history of splenectomy.  PSYCHIATRIC: No history of depression or anxiety.  ENDOCRINOLOGIC: No reports of sweating, cold or heat intolerance. No polyuria or polydipsia.  Marland Kitchen   Physical Examination Today's Vitals   09/16/18 0937  BP: 129/81  Pulse: 77  SpO2: 96%  Weight: 206 lb 9.6 oz (93.7 kg)  Height: 5\' 7"  (1.702 m)   Body mass index is 32.36 kg/m.  Gen: resting comfortably, no acute distress HEENT: no scleral icterus, pupils equal round and reactive, no palptable cervical adenopathy,  CV: RRR, no m/rg, no jvd Resp: Clear to auscultation bilaterally GI: abdomen is soft, non-tender, non-distended, normal bowel sounds, no hepatosplenomegaly MSK: extremities are warm, no edema.  Skin: warm, no rash Neuro:  no focal deficits Psych: appropriate affect   Diagnostic Studies 06/14/13 Cath ANGIOGRAPHIC DATA:The left main coronary artery is absent.  The left anterior descending artery is a large vessel which reaches the apex. There is a large diagonal vessel which is widely patent. There was catheter-induced spasm in the proximal LAD. This improved with intracoronary nitroglycerin.  The left circumflex artery is a large vessel. There is a large first obtuse marginal which is widely  patent. The circumflex system appears angiographically normal.  The right coronary artery is a large vessel. There is a large posterior descending artery. The posterior lateral artery is large as well. The RCA system appears angiographically normal.  LEFT VENTRICULOGRAM:Left ventricular angiogram was done in the 30 RAO projection and revealed normal left ventricular wall motion and systolic function with an estimated ejection fraction of 60 %. LVEDP was 10 mmHg.  ASCENDING AORTOGRAM: There is no evidence of aneurysm. The innominate artery fills and contrast is seen in the carotid. There appears to be some late filling of contrast in the proximal subclavian.  IMPRESSIONS: 1. Absent left main coronary artery. 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 2. Widely patent left anterior descending artery and its branches. Catheter-induced spasm noted in the proximal LAD which improved with IC nitroglycerin. 3. Normal left circumflex artery and its branches. 4. Normal right coronary artery. 5. Normal left ventricular systolic  function. LVEDP 10 mmHg. Ejection fraction 60%. 6. Occluded native right subclavian artery. Of note, she had a vascular surgery procedure when she was a teenager where she states a vessel had to be clipped. 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  RECOMMENDATION:Continue aggressive medical therapy and risk factor modification. I would put her on low-dose amlodipine to help with potential vasospasm. She will be watched overnight. Followup with Dr. Harl Bowie.    Assessment and Plan   1. Chest pain - negative cath 06/2013, question of possible spasm induced chest pain.  -no recent symptoms, continue to monitor.   2. Palpitations -on and off over the years, recently has been worsening with noted elevated heart rates by vital checks - limiting caffeine, no EtOH - EKG today shows NSR - we will obtain a 7 day event monitor. Check BMET/MG/TSH   3. HTN - at goal today, continue current meds  F/u pending monitor results   Arnoldo Lenis, M.D.

## 2018-09-16 NOTE — Patient Instructions (Signed)
Your physician recommends that you schedule a follow-up appointment in: PENDING [place]  Your physician recommends that you continue on your current medications as directed. Please refer to the Current Medication list given to you today.  Your physician has recommended that you wear an event monitor FOR 7 DAYS. Event monitors are medical devices that record the heart's electrical activity. Doctors most often [place] these monitors to diagnose arrhythmias. Arrhythmias are problems with the speed or rhythm of the heartbeat. The monitor is a small, portable device. You can wear one while you do your normal daily activities. This is usually used to diagnose what is causing palpitations/syncope (passing out).  Your physician recommends that you return for lab work BMP/MG/TSH   Thank you for choosing [facility]!!

## 2018-09-21 DIAGNOSIS — M1712 Unilateral primary osteoarthritis, left knee: Secondary | ICD-10-CM | POA: Diagnosis not present

## 2018-09-22 ENCOUNTER — Telehealth: Payer: Self-pay | Admitting: *Deleted

## 2018-09-22 NOTE — Telephone Encounter (Signed)
No Answer MyChart message sent

## 2018-09-22 NOTE — Telephone Encounter (Signed)
-----   Message from Arnoldo Lenis, MD sent at 09/22/2018  3:49 PM EDT ----- Normal labs   Zandra Abts MD

## 2018-09-23 ENCOUNTER — Encounter (INDEPENDENT_AMBULATORY_CARE_PROVIDER_SITE_OTHER): Payer: Medicare Other

## 2018-09-23 DIAGNOSIS — R002 Palpitations: Secondary | ICD-10-CM | POA: Diagnosis not present

## 2018-10-11 ENCOUNTER — Other Ambulatory Visit: Payer: Self-pay

## 2018-10-11 DIAGNOSIS — Z20828 Contact with and (suspected) exposure to other viral communicable diseases: Secondary | ICD-10-CM | POA: Diagnosis not present

## 2018-10-11 DIAGNOSIS — Z20822 Contact with and (suspected) exposure to covid-19: Secondary | ICD-10-CM

## 2018-10-12 ENCOUNTER — Telehealth: Payer: Self-pay | Admitting: *Deleted

## 2018-10-12 NOTE — Telephone Encounter (Signed)
Pt aware and denies any symptoms or palpitations at this time

## 2018-10-12 NOTE — Telephone Encounter (Signed)
-----   Message from Arnoldo Lenis, MD sent at 10/11/2018  2:47 PM EDT ----- Overall normla heart monitor, no significant abnormal rhythms. If ongoing palpitations can increase toprol to 37.5mg  daily, if symptoms are mild then don't have to make any changes   Zandra Abts MD

## 2018-10-13 LAB — NOVEL CORONAVIRUS, NAA: SARS-CoV-2, NAA: NOT DETECTED

## 2018-10-25 DIAGNOSIS — Z79899 Other long term (current) drug therapy: Secondary | ICD-10-CM | POA: Diagnosis not present

## 2018-10-25 DIAGNOSIS — K589 Irritable bowel syndrome without diarrhea: Secondary | ICD-10-CM | POA: Diagnosis not present

## 2018-10-25 DIAGNOSIS — Z8572 Personal history of non-Hodgkin lymphomas: Secondary | ICD-10-CM | POA: Diagnosis not present

## 2018-10-25 DIAGNOSIS — F329 Major depressive disorder, single episode, unspecified: Secondary | ICD-10-CM | POA: Diagnosis not present

## 2018-10-25 DIAGNOSIS — I10 Essential (primary) hypertension: Secondary | ICD-10-CM | POA: Diagnosis not present

## 2018-10-25 DIAGNOSIS — E039 Hypothyroidism, unspecified: Secondary | ICD-10-CM | POA: Diagnosis not present

## 2018-10-25 DIAGNOSIS — G5782 Other specified mononeuropathies of left lower limb: Secondary | ICD-10-CM | POA: Diagnosis not present

## 2018-10-25 DIAGNOSIS — M1712 Unilateral primary osteoarthritis, left knee: Secondary | ICD-10-CM | POA: Diagnosis not present

## 2018-11-08 DIAGNOSIS — H524 Presbyopia: Secondary | ICD-10-CM | POA: Diagnosis not present

## 2018-11-08 DIAGNOSIS — H04123 Dry eye syndrome of bilateral lacrimal glands: Secondary | ICD-10-CM | POA: Diagnosis not present

## 2018-11-08 DIAGNOSIS — H2513 Age-related nuclear cataract, bilateral: Secondary | ICD-10-CM | POA: Diagnosis not present

## 2018-11-08 DIAGNOSIS — H353131 Nonexudative age-related macular degeneration, bilateral, early dry stage: Secondary | ICD-10-CM | POA: Diagnosis not present

## 2018-11-24 DIAGNOSIS — M2242 Chondromalacia patellae, left knee: Secondary | ICD-10-CM | POA: Diagnosis not present

## 2018-11-24 DIAGNOSIS — G894 Chronic pain syndrome: Secondary | ICD-10-CM | POA: Diagnosis not present

## 2018-11-24 DIAGNOSIS — M1712 Unilateral primary osteoarthritis, left knee: Secondary | ICD-10-CM | POA: Diagnosis not present

## 2018-11-24 DIAGNOSIS — M792 Neuralgia and neuritis, unspecified: Secondary | ICD-10-CM | POA: Diagnosis not present

## 2018-11-24 DIAGNOSIS — Z79891 Long term (current) use of opiate analgesic: Secondary | ICD-10-CM | POA: Diagnosis not present

## 2018-12-13 DIAGNOSIS — M1712 Unilateral primary osteoarthritis, left knee: Secondary | ICD-10-CM | POA: Diagnosis not present

## 2018-12-13 DIAGNOSIS — M25562 Pain in left knee: Secondary | ICD-10-CM | POA: Diagnosis not present

## 2018-12-15 DIAGNOSIS — M25562 Pain in left knee: Secondary | ICD-10-CM | POA: Diagnosis not present

## 2019-01-10 DIAGNOSIS — R2681 Unsteadiness on feet: Secondary | ICD-10-CM | POA: Diagnosis not present

## 2019-01-10 DIAGNOSIS — R2689 Other abnormalities of gait and mobility: Secondary | ICD-10-CM | POA: Diagnosis not present

## 2019-01-10 DIAGNOSIS — R531 Weakness: Secondary | ICD-10-CM | POA: Diagnosis not present

## 2019-01-10 DIAGNOSIS — M25562 Pain in left knee: Secondary | ICD-10-CM | POA: Diagnosis not present

## 2019-01-11 DIAGNOSIS — R2689 Other abnormalities of gait and mobility: Secondary | ICD-10-CM | POA: Diagnosis not present

## 2019-01-11 DIAGNOSIS — M25562 Pain in left knee: Secondary | ICD-10-CM | POA: Diagnosis not present

## 2019-01-11 DIAGNOSIS — R2681 Unsteadiness on feet: Secondary | ICD-10-CM | POA: Diagnosis not present

## 2019-01-11 DIAGNOSIS — R531 Weakness: Secondary | ICD-10-CM | POA: Diagnosis not present

## 2019-01-26 DIAGNOSIS — M1712 Unilateral primary osteoarthritis, left knee: Secondary | ICD-10-CM | POA: Diagnosis not present

## 2019-01-30 ENCOUNTER — Ambulatory Visit: Payer: Medicare Other | Admitting: Cardiology

## 2019-02-07 DIAGNOSIS — I10 Essential (primary) hypertension: Secondary | ICD-10-CM | POA: Diagnosis not present

## 2019-02-07 DIAGNOSIS — Z79899 Other long term (current) drug therapy: Secondary | ICD-10-CM | POA: Diagnosis not present

## 2019-02-07 DIAGNOSIS — I959 Hypotension, unspecified: Secondary | ICD-10-CM | POA: Diagnosis not present

## 2019-02-07 DIAGNOSIS — S52501A Unspecified fracture of the lower end of right radius, initial encounter for closed fracture: Secondary | ICD-10-CM | POA: Diagnosis not present

## 2019-02-07 DIAGNOSIS — F329 Major depressive disorder, single episode, unspecified: Secondary | ICD-10-CM | POA: Diagnosis not present

## 2019-02-07 DIAGNOSIS — S63004A Unspecified dislocation of right wrist and hand, initial encounter: Secondary | ICD-10-CM | POA: Diagnosis not present

## 2019-02-07 DIAGNOSIS — Z90710 Acquired absence of both cervix and uterus: Secondary | ICD-10-CM | POA: Diagnosis not present

## 2019-02-07 DIAGNOSIS — E872 Acidosis: Secondary | ICD-10-CM | POA: Diagnosis not present

## 2019-02-07 DIAGNOSIS — S52601A Unspecified fracture of lower end of right ulna, initial encounter for closed fracture: Secondary | ICD-10-CM | POA: Diagnosis not present

## 2019-02-08 ENCOUNTER — Other Ambulatory Visit: Payer: Self-pay | Admitting: Orthopedic Surgery

## 2019-02-08 DIAGNOSIS — Z4789 Encounter for other orthopedic aftercare: Secondary | ICD-10-CM | POA: Diagnosis not present

## 2019-02-08 DIAGNOSIS — S63024A Dislocation of radiocarpal joint of right wrist, initial encounter: Secondary | ICD-10-CM | POA: Diagnosis not present

## 2019-02-08 DIAGNOSIS — S52571A Other intraarticular fracture of lower end of right radius, initial encounter for closed fracture: Secondary | ICD-10-CM | POA: Diagnosis not present

## 2019-02-08 DIAGNOSIS — Z969 Presence of functional implant, unspecified: Secondary | ICD-10-CM | POA: Diagnosis not present

## 2019-02-08 DIAGNOSIS — M85831 Other specified disorders of bone density and structure, right forearm: Secondary | ICD-10-CM | POA: Diagnosis not present

## 2019-02-09 ENCOUNTER — Other Ambulatory Visit (HOSPITAL_COMMUNITY)
Admission: RE | Admit: 2019-02-09 | Discharge: 2019-02-09 | Disposition: A | Discharge status: Home / Self Care | Payer: Medicare Other | Source: Ambulatory Visit | Attending: Orthopedic Surgery | Admitting: Orthopedic Surgery

## 2019-02-09 ENCOUNTER — Encounter (HOSPITAL_BASED_OUTPATIENT_CLINIC_OR_DEPARTMENT_OTHER)
Admission: RE | Disposition: A | Discharge status: Home / Self Care | Payer: Self-pay | Source: Home / Self Care | Attending: Orthopedic Surgery

## 2019-02-09 ENCOUNTER — Ambulatory Visit (HOSPITAL_BASED_OUTPATIENT_CLINIC_OR_DEPARTMENT_OTHER)
Admission: RE | Admit: 2019-02-09 | Discharge: 2019-02-09 | Disposition: A | Discharge status: Home / Self Care | Payer: Medicare Other | Attending: Orthopedic Surgery | Admitting: Orthopedic Surgery

## 2019-02-09 ENCOUNTER — Other Ambulatory Visit: Payer: Self-pay

## 2019-02-09 ENCOUNTER — Ambulatory Visit (HOSPITAL_BASED_OUTPATIENT_CLINIC_OR_DEPARTMENT_OTHER): Payer: Medicare Other | Admitting: Anesthesiology

## 2019-02-09 ENCOUNTER — Encounter (HOSPITAL_BASED_OUTPATIENT_CLINIC_OR_DEPARTMENT_OTHER): Payer: Self-pay | Admitting: Orthopedic Surgery

## 2019-02-09 DIAGNOSIS — I10 Essential (primary) hypertension: Secondary | ICD-10-CM | POA: Diagnosis not present

## 2019-02-09 DIAGNOSIS — Z8572 Personal history of non-Hodgkin lymphomas: Secondary | ICD-10-CM | POA: Diagnosis not present

## 2019-02-09 DIAGNOSIS — Z7982 Long term (current) use of aspirin: Secondary | ICD-10-CM | POA: Diagnosis not present

## 2019-02-09 DIAGNOSIS — Z79890 Hormone replacement therapy (postmenopausal): Secondary | ICD-10-CM | POA: Diagnosis not present

## 2019-02-09 DIAGNOSIS — K589 Irritable bowel syndrome without diarrhea: Secondary | ICD-10-CM | POA: Insufficient documentation

## 2019-02-09 DIAGNOSIS — Z79899 Other long term (current) drug therapy: Secondary | ICD-10-CM | POA: Insufficient documentation

## 2019-02-09 DIAGNOSIS — Z6833 Body mass index (BMI) 33.0-33.9, adult: Secondary | ICD-10-CM | POA: Diagnosis not present

## 2019-02-09 DIAGNOSIS — K219 Gastro-esophageal reflux disease without esophagitis: Secondary | ICD-10-CM | POA: Diagnosis not present

## 2019-02-09 DIAGNOSIS — E669 Obesity, unspecified: Secondary | ICD-10-CM | POA: Insufficient documentation

## 2019-02-09 DIAGNOSIS — Z8249 Family history of ischemic heart disease and other diseases of the circulatory system: Secondary | ICD-10-CM | POA: Insufficient documentation

## 2019-02-09 DIAGNOSIS — W19XXXA Unspecified fall, initial encounter: Secondary | ICD-10-CM | POA: Diagnosis not present

## 2019-02-09 DIAGNOSIS — F419 Anxiety disorder, unspecified: Secondary | ICD-10-CM | POA: Insufficient documentation

## 2019-02-09 DIAGNOSIS — I2510 Atherosclerotic heart disease of native coronary artery without angina pectoris: Secondary | ICD-10-CM | POA: Diagnosis not present

## 2019-02-09 DIAGNOSIS — F329 Major depressive disorder, single episode, unspecified: Secondary | ICD-10-CM | POA: Diagnosis not present

## 2019-02-09 DIAGNOSIS — S63004A Unspecified dislocation of right wrist and hand, initial encounter: Secondary | ICD-10-CM | POA: Diagnosis not present

## 2019-02-09 DIAGNOSIS — G8918 Other acute postprocedural pain: Secondary | ICD-10-CM | POA: Diagnosis not present

## 2019-02-09 DIAGNOSIS — S52571A Other intraarticular fracture of lower end of right radius, initial encounter for closed fracture: Secondary | ICD-10-CM | POA: Diagnosis not present

## 2019-02-09 DIAGNOSIS — S63094A Other dislocation of right wrist and hand, initial encounter: Secondary | ICD-10-CM | POA: Diagnosis not present

## 2019-02-09 DIAGNOSIS — R569 Unspecified convulsions: Secondary | ICD-10-CM | POA: Diagnosis not present

## 2019-02-09 DIAGNOSIS — E039 Hypothyroidism, unspecified: Secondary | ICD-10-CM | POA: Insufficient documentation

## 2019-02-09 HISTORY — PX: OPEN REDUCTION INTERNAL FIXATION (ORIF) DISTAL RADIAL FRACTURE: SHX5989

## 2019-02-09 LAB — RESPIRATORY PANEL BY RT PCR (FLU A&B, COVID)
Influenza A by PCR: NEGATIVE
Influenza B by PCR: NEGATIVE
SARS Coronavirus 2 by RT PCR: NEGATIVE

## 2019-02-09 SURGERY — OPEN REDUCTION INTERNAL FIXATION (ORIF) DISTAL RADIUS FRACTURE
Anesthesia: Monitor Anesthesia Care | Site: Arm Lower | Laterality: Right

## 2019-02-09 MED ORDER — PROPOFOL 10 MG/ML IV BOLUS
INTRAVENOUS | Status: AC
  Filled 2019-02-09: qty 20

## 2019-02-09 MED ORDER — CEFAZOLIN SODIUM-DEXTROSE 2-4 GM/100ML-% IV SOLN
INTRAVENOUS | Status: AC
  Filled 2019-02-09: qty 100

## 2019-02-09 MED ORDER — MIDAZOLAM HCL 2 MG/2ML IJ SOLN
INTRAMUSCULAR | Status: AC
  Filled 2019-02-09: qty 2

## 2019-02-09 MED ORDER — LIDOCAINE 2% (20 MG/ML) 5 ML SYRINGE
INTRAMUSCULAR | Status: DC | PRN
  Administered 2019-02-09: 80 mg via INTRAVENOUS

## 2019-02-09 MED ORDER — PROPOFOL 500 MG/50ML IV EMUL
INTRAVENOUS | Status: DC | PRN
  Administered 2019-02-09: 125 ug/kg/min via INTRAVENOUS

## 2019-02-09 MED ORDER — CEFAZOLIN SODIUM-DEXTROSE 2-4 GM/100ML-% IV SOLN
2.0000 g | INTRAVENOUS | Status: AC
  Administered 2019-02-09: 2 g via INTRAVENOUS

## 2019-02-09 MED ORDER — CHLORHEXIDINE GLUCONATE 4 % EX LIQD: 60.0000 mL | Freq: Once | CUTANEOUS | Status: DC

## 2019-02-09 MED ORDER — ONDANSETRON HCL 4 MG/2ML IJ SOLN: 4.0000 mg | Freq: Once | INTRAMUSCULAR | Status: DC | PRN

## 2019-02-09 MED ORDER — FENTANYL CITRATE (PF) 100 MCG/2ML IJ SOLN
50.0000 ug | INTRAMUSCULAR | Status: DC | PRN
  Administered 2019-02-09: 50 ug via INTRAVENOUS

## 2019-02-09 MED ORDER — ONDANSETRON HCL 4 MG/2ML IJ SOLN
INTRAMUSCULAR | Status: DC | PRN
  Administered 2019-02-09: 4 mg via INTRAVENOUS

## 2019-02-09 MED ORDER — OXYCODONE HCL 5 MG/5ML PO SOLN: 5.0000 mg | Freq: Once | ORAL | Status: DC | PRN

## 2019-02-09 MED ORDER — OXYCODONE HCL 5 MG PO TABS: 5.0000 mg | ORAL_TABLET | Freq: Once | ORAL | Status: DC | PRN

## 2019-02-09 MED ORDER — PROPOFOL 10 MG/ML IV BOLUS
INTRAVENOUS | Status: DC | PRN
  Administered 2019-02-09: 200 mg via INTRAVENOUS
  Administered 2019-02-09: 20 mg via INTRAVENOUS

## 2019-02-09 MED ORDER — FENTANYL CITRATE (PF) 100 MCG/2ML IJ SOLN: 25.0000 ug | INTRAMUSCULAR | Status: DC | PRN

## 2019-02-09 MED ORDER — FENTANYL CITRATE (PF) 100 MCG/2ML IJ SOLN
INTRAMUSCULAR | Status: AC
  Filled 2019-02-09: qty 2

## 2019-02-09 MED ORDER — METOPROLOL TARTRATE 5 MG/5ML IV SOLN
5.0000 mg | Freq: Once | INTRAVENOUS | Status: AC
  Administered 2019-02-09: 5 mg via INTRAVENOUS

## 2019-02-09 MED ORDER — BUPIVACAINE LIPOSOME 1.3 % IJ SUSP
INTRAMUSCULAR | Status: DC | PRN
  Administered 2019-02-09: 5 mg

## 2019-02-09 MED ORDER — MIDAZOLAM HCL 2 MG/2ML IJ SOLN
1.0000 mg | INTRAMUSCULAR | Status: DC | PRN
  Administered 2019-02-09: 2 mg via INTRAVENOUS

## 2019-02-09 MED ORDER — BUPIVACAINE HCL (PF) 0.5 % IJ SOLN
INTRAMUSCULAR | Status: DC | PRN
  Administered 2019-02-09: 25 mL via PERINEURAL

## 2019-02-09 MED ORDER — LACTATED RINGERS IV SOLN: INTRAVENOUS | Status: DC

## 2019-02-09 SURGICAL SUPPLY — 57 items
BIT DRILL 2 QR W/DEPTH MARKS (BIT) ×3
BIT DRILL 2.8 QR W/DEPTH MARKS (BIT) ×3 IMPLANT
BIT DRILL 2MM QR W/DEPTH MARKS (BIT) ×1 IMPLANT
BLADE SURG 15 STRL LF DISP TIS (BLADE) ×2 IMPLANT
BLADE SURG 15 STRL SS (BLADE) ×4
BNDG ELASTIC 3X5.8 VLCR STR LF (GAUZE/BANDAGES/DRESSINGS) ×3 IMPLANT
BNDG ESMARK 4X9 LF (GAUZE/BANDAGES/DRESSINGS) ×3 IMPLANT
BNDG GAUZE ELAST 4 BULKY (GAUZE/BANDAGES/DRESSINGS) ×3 IMPLANT
BNDG PLASTER X FAST 3X3 WHT LF (CAST SUPPLIES) ×30 IMPLANT
CHLORAPREP W/TINT 26 (MISCELLANEOUS) ×3 IMPLANT
CORD BIPOLAR FORCEPS 12FT (ELECTRODE) ×3 IMPLANT
COVER BACK TABLE 60X90IN (DRAPES) ×3 IMPLANT
COVER MAYO STAND STRL (DRAPES) ×3 IMPLANT
COVER WAND RF STERILE (DRAPES) IMPLANT
CUFF TOURN SGL QUICK 18X4 (TOURNIQUET CUFF) ×3 IMPLANT
CUFF TOURN SGL QUICK 24 (TOURNIQUET CUFF)
CUFF TRNQT CYL 24X4X16.5-23 (TOURNIQUET CUFF) IMPLANT
DRAPE EXTREMITY T 121X128X90 (DISPOSABLE) ×3 IMPLANT
DRAPE OEC MINIVIEW 54X84 (DRAPES) ×3 IMPLANT
DRAPE SURG 17X23 STRL (DRAPES) ×3 IMPLANT
GAUZE SPONGE 4X4 12PLY STRL (GAUZE/BANDAGES/DRESSINGS) ×3 IMPLANT
GAUZE XEROFORM 1X8 LF (GAUZE/BANDAGES/DRESSINGS) ×3 IMPLANT
GLOVE BIO SURGEON STRL SZ 6.5 (GLOVE) ×4 IMPLANT
GLOVE BIO SURGEON STRL SZ7.5 (GLOVE) ×3 IMPLANT
GLOVE BIO SURGEONS STRL SZ 6.5 (GLOVE) ×2
GLOVE BIOGEL PI IND STRL 7.0 (GLOVE) ×1 IMPLANT
GLOVE BIOGEL PI IND STRL 8 (GLOVE) ×1 IMPLANT
GLOVE BIOGEL PI IND STRL 8.5 (GLOVE) ×1 IMPLANT
GLOVE BIOGEL PI INDICATOR 7.0 (GLOVE) ×2
GLOVE BIOGEL PI INDICATOR 8 (GLOVE) ×2
GLOVE BIOGEL PI INDICATOR 8.5 (GLOVE) ×2
GLOVE SURG ORTHO 8.0 STRL STRW (GLOVE) IMPLANT
GOWN STRL REUS W/ TWL LRG LVL3 (GOWN DISPOSABLE) ×1 IMPLANT
GOWN STRL REUS W/TWL LRG LVL3 (GOWN DISPOSABLE) ×2
GOWN STRL REUS W/TWL XL LVL3 (GOWN DISPOSABLE) ×6 IMPLANT
NEEDLE HYPO 25X1 1.5 SAFETY (NEEDLE) IMPLANT
NS IRRIG 1000ML POUR BTL (IV SOLUTION) ×3 IMPLANT
PACK BASIN DAY SURGERY FS (CUSTOM PROCEDURE TRAY) ×3 IMPLANT
PAD CAST 3X4 CTTN HI CHSV (CAST SUPPLIES) ×1 IMPLANT
PADDING CAST COTTON 3X4 STRL (CAST SUPPLIES) ×2
PLATE WRIST 171 SPAN SHORT HEX (Plate) ×1 IMPLANT
PLATE WRIST SPANNING SHORT (Plate) ×3 IMPLANT
SCREW LOCK 12X2.7X HEXALOBE (Screw) ×1 IMPLANT
SCREW LOCKING 2.7X12MM (Screw) ×2 IMPLANT
SCREW LOCKING 3.5X10MM (Screw) ×3 IMPLANT
SCREW NON LOCK 3.5X10MM (Screw) ×3 IMPLANT
SCREW NONLOCK HEX 2.7X12 (Screw) ×3 IMPLANT
SCREW NONLOCK HEX 3.5X12 (Screw) ×9 IMPLANT
SLEEVE SCD COMPRESS KNEE MED (MISCELLANEOUS) ×3 IMPLANT
STOCKINETTE 4X48 STRL (DRAPES) ×3 IMPLANT
SUT ETHILON 4 0 PS 2 18 (SUTURE) ×9 IMPLANT
SUT MERSILENE 2.0 SH NDLE (SUTURE) ×3 IMPLANT
SUT VICRYL 4-0 PS2 18IN ABS (SUTURE) ×3 IMPLANT
SYR BULB 3OZ (MISCELLANEOUS) ×3 IMPLANT
SYR CONTROL 10ML LL (SYRINGE) IMPLANT
TOWEL GREEN STERILE FF (TOWEL DISPOSABLE) ×6 IMPLANT
UNDERPAD 30X36 HEAVY ABSORB (UNDERPADS AND DIAPERS) ×3 IMPLANT

## 2019-02-09 NOTE — Discharge Instructions (Addendum)
Post Anesthesia Home Care Instructions  Activity: Get plenty of rest for the remainder of the day. A responsible individual must stay with you for 24 hours following the procedure.  For the next 24 hours, DO NOT: -Drive a car -Paediatric nurse -Drink alcoholic beverages -Take any medication unless instructed by your physician -Make any legal decisions or sign important papers.  Meals: Start with liquid foods such as gelatin or soup. Progress to regular foods as tolerated. Avoid greasy, spicy, heavy foods. If nausea and/or vomiting occur, drink only clear liquids until the nausea and/or vomiting subsides. Call your physician if vomiting continues.  Special Instructions/Symptoms: Your throat may feel dry or sore from the anesthesia or the breathing tube placed in your throat during surgery. If this causes discomfort, gargle with warm salt water. The discomfort should disappear within 24 hours.  If you had a scopolamine patch placed behind your ear for the management of post- operative nausea and/or vomiting:  1. The medication in the patch is effective for 72 hours, after which it should be removed.  Wrap patch in a tissue and discard in the trash. Wash hands thoroughly with soap and water. 2. You may remove the patch earlier than 72 hours if you experience unpleasant side effects which may include dry mouth, dizziness or visual disturbances. 3. Avoid touching the patch. Wash your hands with soap and water after contact with the patch.     Post Anesthesia Home Care Instructions  Activity: Get plenty of rest for the remainder of the day. A responsible individual must stay with you for 24 hours following the procedure.  For the next 24 hours, DO NOT: -Drive a car -Paediatric nurse -Drink alcoholic beverages -Take any medication unless instructed by your physician -Make any legal decisions or sign important papers.  Meals: Start with liquid foods such as gelatin or soup. Progress  to regular foods as tolerated. Avoid greasy, spicy, heavy foods. If nausea and/or vomiting occur, drink only clear liquids until the nausea and/or vomiting subsides. Call your physician if vomiting continues.  Special Instructions/Symptoms: Your throat may feel dry or sore from the anesthesia or the breathing tube placed in your throat during surgery. If this causes discomfort, gargle with warm salt water. The discomfort should disappear within 24 hours.  If you had a scopolamine patch placed behind your ear for the management of post- operative nausea and/or vomiting:  1. The medication in the patch is effective for 72 hours, after which it should be removed.  Wrap patch in a tissue and discard in the trash. Wash hands thoroughly with soap and water. 2. You may remove the patch earlier than 72 hours if you experience unpleasant side effects which may include dry mouth, dizziness or visual disturbances. 3. Avoid touching the patch. Wash your hands with soap and water after contact with the patch.     Post Anesthesia Home Care Instructions  Activity: Get plenty of rest for the remainder of the day. A responsible individual must stay with you for 24 hours following the procedure.  For the next 24 hours, DO NOT: -Drive a car -Paediatric nurse -Drink alcoholic beverages -Take any medication unless instructed by your physician -Make any legal decisions or sign important papers.  Meals: Start with liquid foods such as gelatin or soup. Progress to regular foods as tolerated. Avoid greasy, spicy, heavy foods. If nausea and/or vomiting occur, drink only clear liquids until the nausea and/or vomiting subsides. Call your physician if vomiting continues.  Special  Instructions/Symptoms: Your throat may feel dry or sore from the anesthesia or the breathing tube placed in your throat during surgery. If this causes discomfort, gargle with warm salt water. The discomfort should disappear within 24  hours.  If you had a scopolamine patch placed behind your ear for the management of post- operative nausea and/or vomiting:  1. The medication in the patch is effective for 72 hours, after which it should be removed.  Wrap patch in a tissue and discard in the trash. Wash hands thoroughly with soap and water. 2. You may remove the patch earlier than 72 hours if you experience unpleasant side effects which may include dry mouth, dizziness or visual disturbances. 3. Avoid touching the patch. Wash your hands with soap and water after contact with the patch.     Post Anesthesia Home Care Instructions  Activity: Get plenty of rest for the remainder of the day. A responsible individual must stay with you for 24 hours following the procedure.  For the next 24 hours, DO NOT: -Drive a car -Paediatric nurse -Drink alcoholic beverages -Take any medication unless instructed by your physician -Make any legal decisions or sign important papers.  Meals: Start with liquid foods such as gelatin or soup. Progress to regular foods as tolerated. Avoid greasy, spicy, heavy foods. If nausea and/or vomiting occur, drink only clear liquids until the nausea and/or vomiting subsides. Call your physician if vomiting continues.  Special Instructions/Symptoms: Your throat may feel dry or sore from the anesthesia or the breathing tube placed in your throat during surgery. If this causes discomfort, gargle with warm salt water. The discomfort should disappear within 24 hours.  If you had a scopolamine patch placed behind your ear for the management of post- operative nausea and/or vomiting:  1. The medication in the patch is effective for 72 hours, after which it should be removed.  Wrap patch in a tissue and discard in the trash. Wash hands thoroughly with soap and water. 2. You may remove the patch earlier than 72 hours if you experience unpleasant side effects which may include dry mouth, dizziness or visual  disturbances. 3. Avoid touching the patch. Wash your hands with soap and water after contact with the patch.       Regional Anesthesia Blocks  1. Numbness or the inability to move the "blocked" extremity may last from 3-48 hours after placement. The length of time depends on the medication injected and your individual response to the medication. If the numbness is not going away after 48 hours, call your surgeon.  2. The extremity that is blocked will need to be protected until the numbness is gone and the  Strength has returned. Because you cannot feel it, you will need to take extra care to avoid injury. Because it may be weak, you may have difficulty moving it or using it. You may not know what position it is in without looking at it while the block is in effect.  3. For blocks in the legs and feet, returning to weight bearing and walking needs to be done carefully. You will need to wait until the numbness is entirely gone and the strength has returned. You should be able to move your leg and foot normally before you try and bear weight or walk. You will need someone to be with you when you first try to ensure you do not fall and possibly risk injury.  4. Bruising and tenderness at the needle site are common side effects  and will resolve in a few days.  5. Persistent numbness or new problems with movement should be communicated to the surgeon or the [place] [phone] [place] [phone]).    Information for Discharge Teaching: EXPAREL (bupivacaine liposome injectable suspension)   Your surgeon or anesthesiologist gave you EXPAREL(bupivacaine) to help control your pain after surgery.   EXPAREL is a local anesthetic that provides pain relief by numbing the tissue around the surgical site.  EXPAREL is designed to release pain medication over time and can control pain for up to 72 hours.  Depending on how you respond to EXPAREL, you may require  less pain medication during your recovery.  Possible side effects:  Temporary loss of sensation or ability to move in the area where bupivacaine was injected.  Nausea, vomiting, constipation  Rarely, numbness and tingling in your mouth or lips, lightheadedness, or anxiety may occur.  Call your doctor right away if you think you may be experiencing any of these sensations, or if you have other questions regarding possible side effects.  Follow all other discharge instructions given to you by your surgeon or nurse. Eat a healthy diet and drink plenty of water or other fluids.  If you return to the hospital for any reason within 96 hours following the administration of EXPAREL, it is important for health care providers to know that you have received this anesthetic. A teal colored band has been placed on your arm with the date, time and amount of EXPAREL you have received in order to alert and inform your health care providers. Please leave this armband in place for the full 96 hours following administration, and then you may remove the band.   [facility] Instructions Hand Surgery  Wound Care: Keep your hand elevated above the level of your heart.  Do not allow it to dangle by your side.  Keep the dressing dry and do not remove it unless your doctor advises you to do so.  He will usually change it at the time of your post-op visit.  Moving your fingers is advised to stimulate circulation but will depend on the site of your surgery.  If you have a splint applied, your doctor will advise you regarding movement.  Activity: Do not drive or operate machinery today.  Rest today and then you may return to your normal activity and work as indicated by your physician.  Diet:  Drink liquids today or eat a light diet.  You may resume a regular diet tomorrow.    General expectations: Pain for two to three days. Fingers may become slightly swollen.  Call your doctor if any of the following  occur: Severe pain not relieved by pain medication. Elevated temperature. Dressing soaked with blood. Inability to move fingers. White or bluish color to fingers.

## 2019-02-09 NOTE — Anesthesia Preprocedure Evaluation (Addendum)
Anesthesia Evaluation  Patient identified by MRN, date of birth, ID band Patient awake    Reviewed: Allergy & Precautions, NPO status , Patient's Chart, lab work & pertinent test results, reviewed documented beta blocker date and time   History of Anesthesia Complications Negative for: history of anesthetic complications  Airway Mallampati: II  TM Distance: >3 FB Neck ROM: Full    Dental  (+) Teeth Intact, Implants   Pulmonary neg pulmonary ROS,    Pulmonary exam normal        Cardiovascular hypertension, Pt. on medications and Pt. on home beta blockers + dysrhythmias (palpitations, tachycardia)  Rhythm:Regular Rate:Tachycardia     Neuro/Psych Seizures -,  PSYCHIATRIC DISORDERS Anxiety Depression    GI/Hepatic Neg liver ROS, GERD  ,  Endo/Other  Hypothyroidism   Renal/GU negative Renal ROS  negative genitourinary   Musculoskeletal negative musculoskeletal ROS (+)   Abdominal   Peds  Hematology negative hematology ROS (+)   Anesthesia Other Findings LHC in 2015 for chest pain which showed patent coronaries w/ catheter-induced spasm in the LAD, EF 60%. Saw Dr. Harl Bowie (Cardiology) 09/16/18 at which time she had no recent chest pain. Complained of palpitations but had 7 day event monitor which showed no significant dysrythmia  Reproductive/Obstetrics                           Anesthesia Physical Anesthesia Plan  ASA: III  Anesthesia Plan: MAC and Regional   Post-op Pain Management:  Regional for Post-op pain   Induction: Intravenous  PONV Risk Score and Plan: 2 and Propofol infusion, TIVA and Treatment may vary due to age or medical condition  Airway Management Planned: Natural Airway, Nasal Cannula and Simple Face Mask  Additional Equipment: None  Intra-op Plan:   Post-operative Plan:   Informed Consent: I have reviewed the patients History and Physical, chart, labs and discussed  the procedure including the risks, benefits and alternatives for the proposed anesthesia with the patient or authorized representative who has indicated his/her understanding and acceptance.       Plan Discussed with:   Anesthesia Plan Comments: (Had tachycardia to 170s in short stay with no complaint of palpitations, heart racing, dyspnea or CP. Resolved with 5 mg metoprolol IV and decided to proceed with block.)      Anesthesia Quick Evaluation

## 2019-02-09 NOTE — Anesthesia Procedure Notes (Signed)
Procedure Name: LMA Insertion Date/Time: 02/09/2019 12:28 PM Performed by: Myna Bright, CRNA Pre-anesthesia Checklist: Patient identified, Emergency Drugs available, Suction available and Patient being monitored Patient Re-evaluated:Patient Re-evaluated prior to induction Oxygen Delivery Method: Circle system utilized Preoxygenation: Pre-oxygenation with 100% oxygen Induction Type: IV induction Ventilation: Mask ventilation without difficulty LMA: LMA inserted LMA Size: 4.0 Number of attempts: 1 Placement Confirmation: positive ETCO2 and breath sounds checked- equal and bilateral Tube secured with: Tape Dental Injury: Teeth and Oropharynx as per pre-operative assessment

## 2019-02-09 NOTE — H&P (Signed)
Jamie Hurst is an 74 y.o. female.   Chief Complaint: right distal radius fracture HPI: 74 yo female with right distal radius fracture after fall two days ago.  Had previous ORIF distal radius with subsequent distal ulna resection.  Fracture through radial styloid with dislocation of carpus.  Reduced by ED day of injury and again in office.  She wishes to proceed with operative fixation with bridge plating and possible volar ligament repair.  Allergies: No Known Allergies  Past Medical History:  Diagnosis Date  . Allergic rhinitis   . 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 Anxiety   . CAD (coronary artery disease)   . Depression   . GERD (gastroesophageal reflux disease)   . HTN (hypertension) 06/13/2013  . Hypertension   . Hypothyroidism   . IBS (irritable bowel syndrome)   . Non Hodgkin's lymphoma ([place])   . Seizures ([place])    stress related hx of 1 seizure in 2012, last seizure 6 yrs ago  . Unspecified essential hypertension     Past Surgical History:  Procedure Laterality Date  . ABDOMINAL HYSTERECTOMY  1977  . KNEE ARTHROSCOPY WITH LATERAL MENISECTOMY Left 03/2018  . LAMINECTOMY  2013   x's 2  1st 2003 and 2nd 2013  . LEFT HEART CATHETERIZATION WITH CORONARY ANGIOGRAM N/A 06/14/2013   Procedure: LEFT HEART CATHETERIZATION WITH CORONARY ANGIOGRAM;  Surgeon: Peter M Martinique, MD;  [facility] C[facility];  Service: Cardiovascular;  Laterality: N/A;  . OPEN REDUCTION INTERNAL FIXATION (ORIF) DISTAL RADIAL FRACTURE Right 04/18/2015   Procedure: OPEN REDUCTION INTERNAL FIXATION (ORIF) RIGHT  DISTAL RADIUS OSTEOTOMY;  Surgeon: Leanora Cover, MD;  Location: [facility];  Service: Orthopedics;  Laterality: Right;  . THORACOTOMY  46   age 89  . ULNA OSTEOTOMY Right 08/27/2015   Procedure: Right wrist [place];  Surgeon: Leanora Cover, MD;  Location: [facility];  Service: Orthopedics;  Laterality: Right;  . WRIST CAPSULOTOMY      Family History: Family  History  Problem Relation Age of Onset  . Heart disease Maternal Grandfather   . Diabetes Paternal Grandfather     Social History:   reports that she has never smoked. She has never used smokeless tobacco. She reports that she does not drink alcohol or use drugs.  Medications: Medications Prior to Admission  Medication Sig Dispense Refill  . amLODipine (NORVASC) 10 MG tablet Take 1 tablet (10 mg total) daily by mouth. 90 tablet 1  . aspirin EC 81 MG EC tablet Take 1 tablet (81 mg total) by mouth daily.    . diphenoxylate-atropine (LOMOTIL) 2.5-0.025 MG per tablet Take 2 tablets by mouth 3 (three) times daily as needed for diarrhea or loose stools.     Marland Kitchen FLUoxetine (PROZAC) 40 MG capsule Take 40 mg by mouth daily.    Marland Kitchen levETIRAcetam (KEPPRA) 500 MG tablet Take 1 tablet by mouth 2 (two) times daily.    Marland Kitchen levothyroxine (SYNTHROID) 112 MCG tablet Take 112 mcg by mouth daily before breakfast.    . lisinopril (ZESTRIL) 10 MG tablet Take 10 mg by mouth daily.    . metoprolol succinate (TOPROL-XL) 25 MG 24 hr tablet TAKE 1 TABLET BY MOUTH DAILY 90 tablet 1  . omeprazole (PRILOSEC) 40 MG capsule Take 40 mg by mouth daily.    . temazepam (RESTORIL) 15 MG capsule Take 15 mg by mouth at bedtime as needed for sleep.    Marland Kitchen topiramate (TOPAMAX) 50 MG tablet  Take 1 tablet by mouth 2 (two) times daily as needed.      Results for orders placed or performed during the hospital encounter of 02/09/19 (from the past 48 hour(s))  Respiratory Panel by RT PCR (Flu A&B, Covid) - Nasopharyngeal Swab     Status: None   Collection Time: 02/09/19  8:19 AM   Specimen: Nasopharyngeal Swab  Result Value Ref Range   SARS Coronavirus 2 by RT PCR NEGATIVE NEGATIVE    Comment: (NOTE) SARS-CoV-2 target nucleic acids are NOT DETECTED. The SARS-CoV-2 RNA is generally detectable in upper respiratoy specimens during the acute phase of infection. The lowest concentration of SARS-CoV-2 viral copies this assay can detect  is 131 copies/mL. A negative result does not preclude SARS-Cov-2 infection and should not be used as the sole basis for treatment or other patient management decisions. A negative result may occur with  improper specimen collection/handling, submission of specimen other than nasopharyngeal swab, presence of viral mutation(s) within the areas targeted by this assay, and inadequate number of viral copies (<131 copies/mL). A negative result must be combined with clinical observations, patient history, and epidemiological information. The expected result is Negative. Fact Sheet for Patients:  PinkCheek.be Fact Sheet for Healthcare Providers:  GravelBags.it This test is not yet ap proved or cleared by the [place] FDA and  has been authorized for detection and/or diagnosis of SARS-CoV-2 by FDA under an Emergency Use Authorization (EUA). This EUA will remain  in effect (meaning this test can be used) for the duration of the COVID-19 declaration under Section 564(b)(1) of the Act, 21 U.S.C. section 360bbb-3(b)(1), unless the authorization is terminated or revoked sooner.    Influenza A by PCR NEGATIVE NEGATIVE   Influenza B by PCR NEGATIVE NEGATIVE    Comment: (NOTE) The Xpert Xpress SARS-CoV-2/FLU/RSV assay is intended as an aid in  the diagnosis of influenza from Nasopharyngeal swab specimens and  should not be used as a sole basis for treatment. Nasal washings and  aspirates are unacceptable for Xpert Xpress SARS-CoV-2/FLU/RSV  testing. Fact Sheet for Patients: PinkCheek.be Fact Sheet for Healthcare Providers: GravelBags.it This test is not yet approved or cleared by the [place] FDA and  has been authorized for detection and/or diagnosis of SARS-CoV-2 by  FDA under an Emergency Use Authorization (EUA). This EUA will remain  in effect (meaning this test can be  used) for the duration of the  Covid-19 declaration under Section 564(b)(1) of the Act, 21  U.S.C. section 360bbb-3(b)(1), unless the authorization is  terminated or revoked. Performed at [facility], [place] [address] [place], [place] [address]     No results found.   A comprehensive review of systems was negative.  Blood pressure 129/73, pulse 87, temperature (!) 97.4 F (36.3 C), temperature source Oral, resp. rate 19, height 5\' 7"  (1.702 m), weight 96.2 kg, SpO2 100 %.  General appearance: alert, cooperative and appears stated age Head: Normocephalic, without obvious abnormality, atraumatic Neck: supple, symmetrical, trachea midline Cardio: regular rate and rhythm Resp: clear to auscultation bilaterally Extremities: Intact sensation and capillary refill all digits.  +epl/fpl/io.  No wounds.  Pulses: 2+ and symmetric Skin: Skin color, texture, turgor normal. No rashes or lesions Neurologic: Grossly normal Incision/Wound: none  Assessment/Plan Right distal radius fracture with dorsal carpal dislocation.  Plan bridge plating with possible volar ligament repair possible hardware removal.  Non operative and operative treatment options have been discussed with the patient and patient wishes to proceed with operative  treatment. Risks, benefits, and alternatives of surgery have been discussed and the patient agrees with the plan of care.   Leanora Cover 02/09/2019, 12:00 PM

## 2019-02-09 NOTE — Progress Notes (Signed)
Assisted Dr. Witman with right, ultrasound guided, supraclavicular block. Side rails up, monitors on throughout procedure. See vital signs in flow sheet. Tolerated Procedure well. 

## 2019-02-09 NOTE — Op Note (Signed)
NAME: Jamie Hurst MEDICAL RECORD NO: [id number] DATE OF BIRTH: February 11, 1945 FACILITY: [facility] LOCATION: [facility] PHYSICIAN: Tennis Must, MD   OPERATIVE REPORT   DATE OF PROCEDURE: 02/09/19    PREOPERATIVE DIAGNOSIS:   Right comminuted intra-articular distal radius fracture with dorsal carpal dislocation and retained hardware   POSTOPERATIVE DIAGNOSIS:   Right comminuted intra-articular distal radius fracture with dorsal carpal dislocation and retained hardware   PROCEDURE:   1.  Open reduction and internal fixation of right comminuted intra-articular distal radius fracture with greater than 3 intra-articular fragments using bridge plate 2.  Repair of right volar wrist ligaments   SURGEON:  Leanora Cover, M.D.   ASSISTANT: Daryll Brod, MD   ANESTHESIA:  General with regional   INTRAVENOUS FLUIDS:  Per anesthesia flow sheet.   ESTIMATED BLOOD LOSS:  Minimal.   COMPLICATIONS:  None.   SPECIMENS:  none   TOURNIQUET TIME:    Total Tourniquet Time Documented: Upper Arm (Right) - 100 minutes Total: Upper Arm (Right) - 100 minutes    DISPOSITION:  Stable to PACU.   INDICATIONS: 74 year old female states she fell from standing height 2 days ago injuring her right wrist.  She is had previous open reduction internal fixation of distal radius fracture.  She had a fracture distal to the plate with dislocation of the carpus dorsally.  She was seen in outside facility where radiographs were taken revealing the fracture and dislocation.  A closed reduction was performed by the emergency department staff and she followed up in the office.  Recommended operative fixation with repair of volar wrist ligaments possible removal of hardware. Risks, benefits and alternatives of surgery were discussed including the risks of blood loss, infection, damage to nerves, vessels, tendons, ligaments, bone for surgery, need for additional surgery, complications with wound healing,  continued pain, nonunion, malunion, stiffness.  She voiced understanding of these risks and elected to proceed.  OPERATIVE COURSE:  After being identified preoperatively by myself,  the patient and I agreed on the procedure and site of the procedure.  The surgical site was marked.  Surgical consent had been signed. She was given IV antibiotics as preoperative antibiotic prophylaxis. She was transferred to the operating room and placed on the operating table in supine position with the Right upper extremity on an arm board.  General anesthesia was induced by the anesthesiologist. A regional block had been performed by anesthesia in preoperative holding.   Right upper extremity was prepped and draped in normal sterile orthopedic fashion.  A surgical pause was performed between the surgeons, anesthesia, and operating room staff and all were in agreement as to the patient, procedure, and site of procedure.  Tourniquet at the proximal aspect of the extremity was inflated to 250 mmHg after exsanguination of the arm with an Esmarch bandage.    Previous volar incision was followed.  This is carried in subcutaneous tissues by spreading technique.  The fracture site was identified.  The plate was identified.  The distal screw going into the radial styloid was exposed.  The screw was removed.  There was intra-articular extension of the fracture creating 3 or more intra-articular fragments.  There was a loose piece of cartilage which was removed.  The volar wrist ligaments had avulsed from the distal radius.  The carpus was reduced along with the radial styloid fragment.  Incision was then made on the dorsum of the hand over the long finger metacarpal.  This is carried in subcutaneous tissues by  spreading technique.  The tendons were retracted to expose the dorsum of the metacarpal.  An additional incision was made at the wrist.  Again this was carried in subcutaneous tissues by spreading technique.  The interval between the  third and fourth dorsal compartment tendons was made.  The EPL tendon was intact.  The dorsal aspect of the distal radius was visualized.  There was a prominent screw from the shaft of the plate.  The screw was removed from the volar incision.  Dissected scissors a freer elevator along with a Kelly clamp were used to create the path for the dorsal bridge plate.  The bridge plate was then advanced from the distal wound across the wrist and into the proximal wound and then proximally.  An additional incision was made over the proximal aspect of the plate.  The skin this was carried in subcutaneous tissues by spreading technique.  The plate was identified in this location.  C-arm was used in AP lateral oblique projections to ensure appropriate reduction position of heart which was the case.  The distalmost hole in the plate was filled with a nonlocking screw initially.  The plate was then visualized in the proximal incision and centered over the bone.  Standard AO drilling measuring technique was used to fill the proximal holes with nonlocking screws.  Good purchase was obtained.  The distal holes were then filled.  The slotted hole was filled with a nonlocking screw and the remaining 2 holes were filled with locking screws after removal of the nonlocking screw initially placed.  The C-arm was used in AP lateral oblique projections to ensure appropriate reduction position of hardware which was the case.  The wounds were copiously irrigated with sterile saline and closed with 4-0 nylon in a horizontal mattress fashion.  Through the volar wound the volar wrist ligaments were then extracted from the fracture site and joint.  A K wire was used to clear out the bone from within the guide holes at the plate.  A 2-0 Mersilene suture was then able to be passed through these holes and sutured through the volar wrist ligaments using a free needle.  This was then tied down providing a good reapproximation of the volar wrist  ligaments to the distal radius.  The wound was then copiously irrigated with sterile saline.  It was closed with inverted interrupted 4-0 Vicryl suture in the subcutaneous tissues and the skin was closed with 4-0 nylon in a horizontal mattress fashion.  The wounds were all dressed with sterile Xeroform 4 x 4's and wrapped with a Kerlix bandage.  A volar splint was placed and wrapped with Kerlix and Ace bandage.  The tourniquet was deflated at 100 minutes.  Fingertips were pink with brisk capillary refill after deflation of tourniquet.  The operative  drapes were broken down.  The patient was awoken from anesthesia safely.  She was transferred back to the stretcher and taken to PACU in stable condition.  I will see her back in the office in 1 week for postoperative followup.  She was given a prescription for pain medication at her office visit yesterday.   Leanora Cover, MD Electronically signed, 02/09/19

## 2019-02-09 NOTE — Anesthesia Postprocedure Evaluation (Signed)
Anesthesia Post Note  Patient: Jamie Hurst  Procedure(s) Performed: OPEN REDUCTION INTERNAL FIXATION (ORIF) DISTAL RADIAL FRACTURE (Right Arm Lower)     Patient location during evaluation: PACU Anesthesia Type: General and Regional Level of consciousness: awake and alert Pain management: pain level controlled Vital Signs Assessment: post-procedure vital signs reviewed and stable Respiratory status: spontaneous breathing, nonlabored ventilation, respiratory function stable and patient connected to nasal cannula oxygen Cardiovascular status: blood pressure returned to baseline and stable Postop Assessment: no apparent nausea or vomiting Anesthetic complications: no    Last Vitals:  Vitals:   02/09/19 1445 02/09/19 1525  BP: 132/79 (!) 141/70  Pulse: 72 75  Resp: (!) 21 20  Temp:  (!) 36.4 C  SpO2: 100% 94%    Last Pain:  Vitals:   02/09/19 1525  TempSrc: Oral  PainSc: 0-No pain                 Modestine Scherzinger

## 2019-02-09 NOTE — Anesthesia Procedure Notes (Signed)
Anesthesia Regional Block: Supraclavicular block   Pre-Anesthetic Checklist: ,, timeout performed, Correct Patient, Correct Site, Correct Laterality, Correct Procedure, Correct Position, site marked, Risks and benefits discussed,  Surgical consent,  Pre-op evaluation,  At surgeon's request and post-op pain management  Laterality: Right  Prep: chloraprep       Needles:  Injection technique: Single-shot  Needle Type: Echogenic Stimulator Needle     Needle Length: 9cm  Needle Gauge: 21     Additional Needles:   Procedures:,,,, ultrasound used (permanent image in chart),,,,  Narrative:  Start time: 02/09/2019 11:42 AM End time: 02/09/2019 11:46 AM Injection made incrementally with aspirations every 5 mL.  Performed by: Personally  Anesthesiologist: Lidia Collum, MD  Additional Notes: Monitors applied. Injection made in 5cc increments. No resistance to injection. Good needle visualization. Patient tolerated procedure well.

## 2019-02-09 NOTE — Transfer of Care (Signed)
Immediate Anesthesia Transfer of Care Note  Patient: Jamie Hurst  Procedure(s) Performed: OPEN REDUCTION INTERNAL FIXATION (ORIF) DISTAL RADIAL FRACTURE (Right Arm Lower)  Patient Location: PACU  Anesthesia Type:GA combined with regional for post-op pain  Level of Consciousness: awake, alert , oriented, drowsy and patient cooperative  Airway & Oxygen Therapy: Patient Spontanous Breathing and Patient connected to face mask oxygen  Post-op Assessment: Report given to RN and Post -op Vital signs reviewed and stable  Post vital signs: Reviewed and stable  Last Vitals:  Vitals Value Taken Time  BP 118/67 02/09/19 1425  Temp    Pulse 72 02/09/19 1429  Resp 24 02/09/19 1429  SpO2 96 % 02/09/19 1429  Vitals shown include unvalidated device data.  Last Pain:  Vitals:   02/09/19 1047  TempSrc: Oral  PainSc: 8       Patients Stated Pain Goal: 5 ([id number])  Complications: No apparent anesthesia complications

## 2019-02-09 NOTE — Op Note (Signed)
I assisted Surgeon(s) and Role:    * Leanora Cover, MD - Primary    Daryll Brod, MD on the Procedure(s): OPEN REDUCTION INTERNAL FIXATION (ORIF) DISTAL RADIAL FRACTURE on 02/09/2019.  I provided assistance on this case as follows: Set up, approach, identification of the fracture debridement and identification of the dislocation, removal of deep hardware with application of a bridge plate fixation of the bridge plate for reduction of the distal radius fracture and followed by reduction of the dislocation of the carpus, repair of f volar ligaments, closure of the wound and application of the dressings and splints.  Electronically signed by: Daryll Brod, MD Date: 02/09/2019 Time: 2:23 PM

## 2019-02-14 ENCOUNTER — Encounter: Payer: Self-pay | Admitting: *Deleted

## 2019-02-15 DIAGNOSIS — S52571D Other intraarticular fracture of lower end of right radius, subsequent encounter for closed fracture with routine healing: Secondary | ICD-10-CM | POA: Diagnosis not present

## 2019-03-09 DIAGNOSIS — Z23 Encounter for immunization: Secondary | ICD-10-CM | POA: Diagnosis not present

## 2019-03-15 DIAGNOSIS — Z79899 Other long term (current) drug therapy: Secondary | ICD-10-CM | POA: Diagnosis not present

## 2019-03-15 DIAGNOSIS — Z01818 Encounter for other preprocedural examination: Secondary | ICD-10-CM | POA: Diagnosis not present

## 2019-03-15 DIAGNOSIS — I10 Essential (primary) hypertension: Secondary | ICD-10-CM | POA: Diagnosis not present

## 2019-03-15 DIAGNOSIS — M1712 Unilateral primary osteoarthritis, left knee: Secondary | ICD-10-CM | POA: Diagnosis not present

## 2019-03-15 DIAGNOSIS — Z01812 Encounter for preprocedural laboratory examination: Secondary | ICD-10-CM | POA: Diagnosis not present

## 2019-03-15 DIAGNOSIS — Z01810 Encounter for preprocedural cardiovascular examination: Secondary | ICD-10-CM | POA: Diagnosis not present

## 2019-03-15 DIAGNOSIS — E669 Obesity, unspecified: Secondary | ICD-10-CM | POA: Diagnosis not present

## 2019-03-15 DIAGNOSIS — Z6829 Body mass index (BMI) 29.0-29.9, adult: Secondary | ICD-10-CM | POA: Diagnosis not present

## 2019-03-22 DIAGNOSIS — S52571D Other intraarticular fracture of lower end of right radius, subsequent encounter for closed fracture with routine healing: Secondary | ICD-10-CM | POA: Diagnosis not present

## 2019-03-24 DIAGNOSIS — Z01818 Encounter for other preprocedural examination: Secondary | ICD-10-CM | POA: Diagnosis not present

## 2019-03-27 DIAGNOSIS — Z7982 Long term (current) use of aspirin: Secondary | ICD-10-CM | POA: Diagnosis not present

## 2019-03-27 DIAGNOSIS — F329 Major depressive disorder, single episode, unspecified: Secondary | ICD-10-CM | POA: Diagnosis present

## 2019-03-27 DIAGNOSIS — K589 Irritable bowel syndrome without diarrhea: Secondary | ICD-10-CM | POA: Diagnosis present

## 2019-03-27 DIAGNOSIS — E669 Obesity, unspecified: Secondary | ICD-10-CM | POA: Diagnosis not present

## 2019-03-27 DIAGNOSIS — Z79899 Other long term (current) drug therapy: Secondary | ICD-10-CM | POA: Diagnosis not present

## 2019-03-27 DIAGNOSIS — G8918 Other acute postprocedural pain: Secondary | ICD-10-CM | POA: Diagnosis not present

## 2019-03-27 DIAGNOSIS — Z79890 Hormone replacement therapy (postmenopausal): Secondary | ICD-10-CM | POA: Diagnosis not present

## 2019-03-27 DIAGNOSIS — Z86718 Personal history of other venous thrombosis and embolism: Secondary | ICD-10-CM | POA: Diagnosis not present

## 2019-03-27 DIAGNOSIS — Z6830 Body mass index (BMI) 30.0-30.9, adult: Secondary | ICD-10-CM | POA: Diagnosis not present

## 2019-03-27 DIAGNOSIS — E039 Hypothyroidism, unspecified: Secondary | ICD-10-CM | POA: Diagnosis present

## 2019-03-27 DIAGNOSIS — Z8572 Personal history of non-Hodgkin lymphomas: Secondary | ICD-10-CM | POA: Diagnosis not present

## 2019-03-27 DIAGNOSIS — M13862 Other specified arthritis, left knee: Secondary | ICD-10-CM | POA: Diagnosis not present

## 2019-03-27 DIAGNOSIS — I10 Essential (primary) hypertension: Secondary | ICD-10-CM | POA: Diagnosis present

## 2019-03-27 DIAGNOSIS — M1712 Unilateral primary osteoarthritis, left knee: Secondary | ICD-10-CM | POA: Diagnosis present

## 2019-03-27 DIAGNOSIS — Z9221 Personal history of antineoplastic chemotherapy: Secondary | ICD-10-CM | POA: Diagnosis not present

## 2019-04-05 DIAGNOSIS — Z96652 Presence of left artificial knee joint: Secondary | ICD-10-CM | POA: Diagnosis not present

## 2019-04-05 DIAGNOSIS — R2689 Other abnormalities of gait and mobility: Secondary | ICD-10-CM | POA: Diagnosis not present

## 2019-04-05 DIAGNOSIS — R531 Weakness: Secondary | ICD-10-CM | POA: Diagnosis not present

## 2019-04-10 DIAGNOSIS — R531 Weakness: Secondary | ICD-10-CM | POA: Diagnosis not present

## 2019-04-10 DIAGNOSIS — R2689 Other abnormalities of gait and mobility: Secondary | ICD-10-CM | POA: Diagnosis not present

## 2019-04-10 DIAGNOSIS — Z96652 Presence of left artificial knee joint: Secondary | ICD-10-CM | POA: Diagnosis not present

## 2019-04-12 DIAGNOSIS — R2689 Other abnormalities of gait and mobility: Secondary | ICD-10-CM | POA: Diagnosis not present

## 2019-04-12 DIAGNOSIS — R531 Weakness: Secondary | ICD-10-CM | POA: Diagnosis not present

## 2019-04-12 DIAGNOSIS — Z96652 Presence of left artificial knee joint: Secondary | ICD-10-CM | POA: Diagnosis not present

## 2019-04-13 DIAGNOSIS — M1712 Unilateral primary osteoarthritis, left knee: Secondary | ICD-10-CM | POA: Diagnosis not present

## 2019-04-13 DIAGNOSIS — Z96652 Presence of left artificial knee joint: Secondary | ICD-10-CM | POA: Diagnosis not present

## 2019-04-14 DIAGNOSIS — Z23 Encounter for immunization: Secondary | ICD-10-CM | POA: Diagnosis not present

## 2019-04-18 DIAGNOSIS — R2689 Other abnormalities of gait and mobility: Secondary | ICD-10-CM | POA: Diagnosis not present

## 2019-04-18 DIAGNOSIS — R531 Weakness: Secondary | ICD-10-CM | POA: Diagnosis not present

## 2019-04-18 DIAGNOSIS — Z96652 Presence of left artificial knee joint: Secondary | ICD-10-CM | POA: Diagnosis not present

## 2019-04-20 DIAGNOSIS — R2689 Other abnormalities of gait and mobility: Secondary | ICD-10-CM | POA: Diagnosis not present

## 2019-04-20 DIAGNOSIS — R531 Weakness: Secondary | ICD-10-CM | POA: Diagnosis not present

## 2019-04-20 DIAGNOSIS — Z96652 Presence of left artificial knee joint: Secondary | ICD-10-CM | POA: Diagnosis not present

## 2019-04-25 DIAGNOSIS — R2689 Other abnormalities of gait and mobility: Secondary | ICD-10-CM | POA: Diagnosis not present

## 2019-04-25 DIAGNOSIS — R531 Weakness: Secondary | ICD-10-CM | POA: Diagnosis not present

## 2019-04-25 DIAGNOSIS — Z96652 Presence of left artificial knee joint: Secondary | ICD-10-CM | POA: Diagnosis not present

## 2019-05-03 DIAGNOSIS — S52571D Other intraarticular fracture of lower end of right radius, subsequent encounter for closed fracture with routine healing: Secondary | ICD-10-CM | POA: Diagnosis not present

## 2019-05-05 ENCOUNTER — Other Ambulatory Visit: Payer: Self-pay | Admitting: Orthopedic Surgery

## 2019-05-05 DIAGNOSIS — S52571D Other intraarticular fracture of lower end of right radius, subsequent encounter for closed fracture with routine healing: Secondary | ICD-10-CM

## 2019-05-11 DIAGNOSIS — S83412A Sprain of medial collateral ligament of left knee, initial encounter: Secondary | ICD-10-CM | POA: Diagnosis not present

## 2019-05-11 DIAGNOSIS — Z96652 Presence of left artificial knee joint: Secondary | ICD-10-CM | POA: Diagnosis not present

## 2019-05-25 DIAGNOSIS — Z96652 Presence of left artificial knee joint: Secondary | ICD-10-CM | POA: Diagnosis not present

## 2019-05-25 DIAGNOSIS — S83412D Sprain of medial collateral ligament of left knee, subsequent encounter: Secondary | ICD-10-CM | POA: Diagnosis not present

## 2019-05-26 ENCOUNTER — Ambulatory Visit
Admission: RE | Admit: 2019-05-26 | Discharge: 2019-05-26 | Disposition: A | Discharge status: Home / Self Care | Payer: Medicare Other | Source: Ambulatory Visit | Attending: Orthopedic Surgery | Admitting: Orthopedic Surgery

## 2019-05-26 ENCOUNTER — Other Ambulatory Visit: Payer: Self-pay

## 2019-05-26 DIAGNOSIS — S52501A Unspecified fracture of the lower end of right radius, initial encounter for closed fracture: Secondary | ICD-10-CM | POA: Diagnosis not present

## 2019-05-26 DIAGNOSIS — S52571D Other intraarticular fracture of lower end of right radius, subsequent encounter for closed fracture with routine healing: Secondary | ICD-10-CM

## 2019-05-31 DIAGNOSIS — S52571D Other intraarticular fracture of lower end of right radius, subsequent encounter for closed fracture with routine healing: Secondary | ICD-10-CM | POA: Diagnosis not present

## 2019-05-31 DIAGNOSIS — Z969 Presence of functional implant, unspecified: Secondary | ICD-10-CM | POA: Diagnosis not present

## 2019-06-01 ENCOUNTER — Other Ambulatory Visit: Payer: Self-pay | Admitting: Orthopedic Surgery

## 2019-06-07 ENCOUNTER — Encounter (HOSPITAL_BASED_OUTPATIENT_CLINIC_OR_DEPARTMENT_OTHER): Payer: Self-pay | Admitting: Orthopedic Surgery

## 2019-06-07 ENCOUNTER — Other Ambulatory Visit: Payer: Self-pay

## 2019-06-07 ENCOUNTER — Telehealth: Payer: Self-pay | Admitting: Cardiology

## 2019-06-07 NOTE — Telephone Encounter (Signed)
   [facility] Pre-operative Risk Assessment    HEARTCARE STAFF: - Please ensure there is not already an duplicate clearance open for this procedure. - Under Visit Info/Reason for Call, type in Other and utilize the format Clearance MM/DD/YY or Clearance TBD. Do not use dashes or single digits. - If request is for dental extraction, please clarify the # of teeth to be extracted.  Request for surgical clearance:  What type of surgery is being performed? Removal of hardware in right wrist 1. When is this surgery scheduled? June 16, 2019  2. What type of clearance is required (medical clearance vs. Pharmacy clearance to hold med vs. Both)? Medical and pharmacy  3. Are there any medications that need to be held prior to surgery and how long? Aspirin - asking for instructions  4. Practice name and name of physician performing surgery?  [facility]         Dr Leanora Cover  5. What is the office phone number? [phone]   7.   What is the office fax number? [place], RN   8.   Anesthesia type (None, local, MAC, general) ? Choice   Weston Anna 06/07/2019, 10:26 AM  _________________________________________________________________   (provider comments below)

## 2019-06-08 NOTE — Telephone Encounter (Signed)
   Primary Cardiologist: Carlyle Dolly, MD  Chart reviewed as part of pre-operative protocol coverage. Patient was contacted 06/08/2019 in reference to pre-operative risk assessment for pending surgery as outlined below.  Phebie Hyche was last seen on 09/16/2018 by Dr. Harl Bowie.  Since that day, Taysha Kinsley has done . 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 She has recovered well and without significant complication. She leads a very active lifestyle and can clearly accomplish 4 METS of activity without any chest pain or shortness of breath. Previous cath in 2015 showed normal coronary arteries. Last heart monitor in Sept 2020 was reassuring.   Therefore, based on ACC/AHA guidelines, the patient would be at acceptable risk for the planned procedure without further cardiovascular testing.   I will route this recommendation to the requesting party via Epic fax function and remove from pre-op pool. Please call with questions.  [place], [place] 06/08/2019, 2:57 PM

## 2019-06-12 NOTE — Telephone Encounter (Signed)
Addendum: patient may hold aspirin prior to the procedure. Usually 5-7 days before.

## 2019-06-13 ENCOUNTER — Other Ambulatory Visit (HOSPITAL_COMMUNITY): Payer: Medicare Other

## 2019-06-13 NOTE — Telephone Encounter (Signed)
Detailed message left on patient's VM per DPR letting patient know that she was cleared for surgery and she may hold ASA prior to her procedure (typically 5-7 days prior). Instructed the patient to call back with any questions. Clearance faxed to Cyndee Brightly at [facility].

## 2019-06-15 ENCOUNTER — Other Ambulatory Visit (HOSPITAL_COMMUNITY)
Admission: RE | Admit: 2019-06-15 | Discharge: 2019-06-15 | Disposition: A | Discharge status: Home / Self Care | Payer: Medicare Other | Source: Ambulatory Visit | Attending: Orthopedic Surgery | Admitting: Orthopedic Surgery

## 2019-06-15 DIAGNOSIS — Z20822 Contact with and (suspected) exposure to covid-19: Secondary | ICD-10-CM | POA: Insufficient documentation

## 2019-06-15 DIAGNOSIS — Z01812 Encounter for preprocedural laboratory examination: Secondary | ICD-10-CM | POA: Insufficient documentation

## 2019-06-15 LAB — SARS CORONAVIRUS 2 (TAT 6-24 HRS): SARS Coronavirus 2: NEGATIVE

## 2019-06-15 NOTE — Progress Notes (Signed)

## 2019-06-16 ENCOUNTER — Encounter (HOSPITAL_BASED_OUTPATIENT_CLINIC_OR_DEPARTMENT_OTHER): Payer: Self-pay | Admitting: Orthopedic Surgery

## 2019-06-16 ENCOUNTER — Other Ambulatory Visit: Payer: Self-pay

## 2019-06-16 ENCOUNTER — Ambulatory Visit (HOSPITAL_BASED_OUTPATIENT_CLINIC_OR_DEPARTMENT_OTHER)
Admission: RE | Admit: 2019-06-16 | Discharge: 2019-06-16 | Disposition: A | Discharge status: Home / Self Care | Payer: Medicare Other | Attending: Orthopedic Surgery | Admitting: Orthopedic Surgery

## 2019-06-16 ENCOUNTER — Ambulatory Visit (HOSPITAL_BASED_OUTPATIENT_CLINIC_OR_DEPARTMENT_OTHER): Payer: Medicare Other | Admitting: Anesthesiology

## 2019-06-16 ENCOUNTER — Encounter (HOSPITAL_BASED_OUTPATIENT_CLINIC_OR_DEPARTMENT_OTHER)
Admission: RE | Disposition: A | Discharge status: Home / Self Care | Payer: Self-pay | Source: Home / Self Care | Attending: Orthopedic Surgery

## 2019-06-16 DIAGNOSIS — Z8572 Personal history of non-Hodgkin lymphomas: Secondary | ICD-10-CM | POA: Insufficient documentation

## 2019-06-16 DIAGNOSIS — K219 Gastro-esophageal reflux disease without esophagitis: Secondary | ICD-10-CM | POA: Insufficient documentation

## 2019-06-16 DIAGNOSIS — R002 Palpitations: Secondary | ICD-10-CM | POA: Insufficient documentation

## 2019-06-16 DIAGNOSIS — F329 Major depressive disorder, single episode, unspecified: Secondary | ICD-10-CM | POA: Insufficient documentation

## 2019-06-16 DIAGNOSIS — Z472 Encounter for removal of internal fixation device: Secondary | ICD-10-CM | POA: Insufficient documentation

## 2019-06-16 DIAGNOSIS — Z8249 Family history of ischemic heart disease and other diseases of the circulatory system: Secondary | ICD-10-CM | POA: Insufficient documentation

## 2019-06-16 DIAGNOSIS — R569 Unspecified convulsions: Secondary | ICD-10-CM | POA: Diagnosis not present

## 2019-06-16 DIAGNOSIS — Z79899 Other long term (current) drug therapy: Secondary | ICD-10-CM | POA: Diagnosis not present

## 2019-06-16 DIAGNOSIS — Z7982 Long term (current) use of aspirin: Secondary | ICD-10-CM | POA: Insufficient documentation

## 2019-06-16 DIAGNOSIS — F419 Anxiety disorder, unspecified: Secondary | ICD-10-CM | POA: Insufficient documentation

## 2019-06-16 DIAGNOSIS — I2510 Atherosclerotic heart disease of native coronary artery without angina pectoris: Secondary | ICD-10-CM | POA: Insufficient documentation

## 2019-06-16 DIAGNOSIS — K589 Irritable bowel syndrome without diarrhea: Secondary | ICD-10-CM | POA: Insufficient documentation

## 2019-06-16 DIAGNOSIS — E039 Hypothyroidism, unspecified: Secondary | ICD-10-CM | POA: Diagnosis not present

## 2019-06-16 DIAGNOSIS — Z90710 Acquired absence of both cervix and uterus: Secondary | ICD-10-CM | POA: Diagnosis not present

## 2019-06-16 DIAGNOSIS — I714 Abdominal aortic aneurysm, without rupture: Secondary | ICD-10-CM | POA: Insufficient documentation

## 2019-06-16 DIAGNOSIS — Z833 Family history of diabetes mellitus: Secondary | ICD-10-CM | POA: Diagnosis not present

## 2019-06-16 DIAGNOSIS — S52501D Unspecified fracture of the lower end of right radius, subsequent encounter for closed fracture with routine healing: Secondary | ICD-10-CM | POA: Diagnosis not present

## 2019-06-16 DIAGNOSIS — R000 Tachycardia, unspecified: Secondary | ICD-10-CM | POA: Insufficient documentation

## 2019-06-16 DIAGNOSIS — I10 Essential (primary) hypertension: Secondary | ICD-10-CM | POA: Diagnosis not present

## 2019-06-16 HISTORY — PX: HARDWARE REMOVAL: SHX979

## 2019-06-16 SURGERY — REMOVAL, HARDWARE
Anesthesia: Monitor Anesthesia Care | Site: Wrist | Laterality: Right

## 2019-06-16 MED ORDER — HYDROCODONE-ACETAMINOPHEN 5-325 MG PO TABS: ORAL_TABLET | ORAL | 0 refills | Status: AC

## 2019-06-16 MED ORDER — CEFAZOLIN SODIUM-DEXTROSE 2-4 GM/100ML-% IV SOLN
INTRAVENOUS | Status: AC
  Filled 2019-06-16: qty 100

## 2019-06-16 MED ORDER — ACETAMINOPHEN 500 MG PO TABS
1000.0000 mg | ORAL_TABLET | Freq: Once | ORAL | Status: AC
  Administered 2019-06-16: 1000 mg via ORAL

## 2019-06-16 MED ORDER — FENTANYL CITRATE (PF) 100 MCG/2ML IJ SOLN
INTRAMUSCULAR | Status: AC
  Filled 2019-06-16: qty 2

## 2019-06-16 MED ORDER — MIDAZOLAM HCL 2 MG/2ML IJ SOLN
INTRAMUSCULAR | Status: AC
  Filled 2019-06-16: qty 2

## 2019-06-16 MED ORDER — CELECOXIB 200 MG PO CAPS
200.0000 mg | ORAL_CAPSULE | Freq: Once | ORAL | Status: AC
  Administered 2019-06-16: 200 mg via ORAL

## 2019-06-16 MED ORDER — PROPOFOL 10 MG/ML IV BOLUS
INTRAVENOUS | Status: DC | PRN
  Administered 2019-06-16 (×3): 20 mg via INTRAVENOUS

## 2019-06-16 MED ORDER — PROPOFOL 500 MG/50ML IV EMUL
INTRAVENOUS | Status: DC | PRN
  Administered 2019-06-16: 75 ug/kg/min via INTRAVENOUS

## 2019-06-16 MED ORDER — MIDAZOLAM HCL 2 MG/2ML IJ SOLN
1.0000 mg | INTRAMUSCULAR | Status: DC | PRN
  Administered 2019-06-16: 2 mg via INTRAVENOUS

## 2019-06-16 MED ORDER — ACETAMINOPHEN 500 MG PO TABS
ORAL_TABLET | ORAL | Status: AC
  Filled 2019-06-16: qty 2

## 2019-06-16 MED ORDER — BUPIVACAINE HCL (PF) 0.5 % IJ SOLN
INTRAMUSCULAR | Status: DC | PRN
  Administered 2019-06-16: 20 mL

## 2019-06-16 MED ORDER — LACTATED RINGERS IV SOLN: INTRAVENOUS | Status: DC

## 2019-06-16 MED ORDER — 0.9 % SODIUM CHLORIDE (POUR BTL) OPTIME
TOPICAL | Status: DC | PRN
  Administered 2019-06-16: 200 mL

## 2019-06-16 MED ORDER — ONDANSETRON HCL 4 MG/2ML IJ SOLN
INTRAMUSCULAR | Status: DC | PRN
  Administered 2019-06-16: 4 mg via INTRAVENOUS

## 2019-06-16 MED ORDER — FENTANYL CITRATE (PF) 100 MCG/2ML IJ SOLN: 25.0000 ug | INTRAMUSCULAR | Status: DC | PRN

## 2019-06-16 MED ORDER — HYDROCODONE-ACETAMINOPHEN 5-325 MG PO TABS: ORAL_TABLET | ORAL | 0 refills | Status: DC

## 2019-06-16 MED ORDER — FENTANYL CITRATE (PF) 100 MCG/2ML IJ SOLN
50.0000 ug | INTRAMUSCULAR | Status: DC | PRN
  Administered 2019-06-16: 100 ug via INTRAVENOUS

## 2019-06-16 MED ORDER — CEFAZOLIN SODIUM-DEXTROSE 2-4 GM/100ML-% IV SOLN
2.0000 g | INTRAVENOUS | Status: AC
  Administered 2019-06-16: 2 g via INTRAVENOUS

## 2019-06-16 MED ORDER — PROMETHAZINE HCL 25 MG/ML IJ SOLN: 6.2500 mg | INTRAMUSCULAR | Status: DC | PRN

## 2019-06-16 MED ORDER — CELECOXIB 200 MG PO CAPS
ORAL_CAPSULE | ORAL | Status: AC
  Filled 2019-06-16: qty 1

## 2019-06-16 MED ORDER — BUPIVACAINE IN DEXTROSE 0.75-8.25 % IT SOLN
INTRATHECAL | Status: DC | PRN
  Administered 2019-06-16: 10 mL via INTRATHECAL

## 2019-06-16 SURGICAL SUPPLY — 46 items
BLADE MINI RND TIP GREEN BEAV (BLADE) IMPLANT
BLADE SURG 15 STRL LF DISP TIS (BLADE) ×2 IMPLANT
BLADE SURG 15 STRL SS (BLADE) ×4
BNDG COHESIVE 1X5 TAN STRL LF (GAUZE/BANDAGES/DRESSINGS) IMPLANT
BNDG ELASTIC 2X5.8 VLCR STR LF (GAUZE/BANDAGES/DRESSINGS) IMPLANT
BNDG ELASTIC 3X5.8 VLCR STR LF (GAUZE/BANDAGES/DRESSINGS) ×3 IMPLANT
BNDG ESMARK 4X9 LF (GAUZE/BANDAGES/DRESSINGS) ×3 IMPLANT
BNDG GAUZE ELAST 4 BULKY (GAUZE/BANDAGES/DRESSINGS) ×3 IMPLANT
CHLORAPREP W/TINT 26 (MISCELLANEOUS) ×3 IMPLANT
CLOSURE WOUND 1/4X4 (GAUZE/BANDAGES/DRESSINGS)
CORD BIPOLAR FORCEPS 12FT (ELECTRODE) ×3 IMPLANT
COVER BACK TABLE 60X90IN (DRAPES) ×3 IMPLANT
COVER MAYO STAND STRL (DRAPES) ×3 IMPLANT
COVER WAND RF STERILE (DRAPES) IMPLANT
CUFF TOURN SGL QUICK 18X4 (TOURNIQUET CUFF) ×3 IMPLANT
DECANTER SPIKE VIAL GLASS SM (MISCELLANEOUS) IMPLANT
DRAPE EXTREMITY T 121X128X90 (DISPOSABLE) ×3 IMPLANT
DRAPE SURG 17X23 STRL (DRAPES) ×3 IMPLANT
DRSG PAD ABDOMINAL 8X10 ST (GAUZE/BANDAGES/DRESSINGS) IMPLANT
GAUZE SPONGE 4X4 12PLY STRL (GAUZE/BANDAGES/DRESSINGS) ×3 IMPLANT
GAUZE XEROFORM 1X8 LF (GAUZE/BANDAGES/DRESSINGS) ×3 IMPLANT
GLOVE BIO SURGEON STRL SZ7.5 (GLOVE) ×3 IMPLANT
GLOVE BIOGEL PI IND STRL 8 (GLOVE) ×1 IMPLANT
GLOVE BIOGEL PI INDICATOR 8 (GLOVE) ×2
GOWN STRL REUS W/ TWL LRG LVL3 (GOWN DISPOSABLE) ×1 IMPLANT
GOWN STRL REUS W/TWL LRG LVL3 (GOWN DISPOSABLE) ×2
GOWN STRL REUS W/TWL XL LVL3 (GOWN DISPOSABLE) ×3 IMPLANT
NEEDLE HYPO 25X1 1.5 SAFETY (NEEDLE) IMPLANT
NS IRRIG 1000ML POUR BTL (IV SOLUTION) ×3 IMPLANT
PAD CAST 3X4 CTTN HI CHSV (CAST SUPPLIES) ×1 IMPLANT
PADDING CAST ABS 4INX4YD NS (CAST SUPPLIES) ×2
PADDING CAST ABS COTTON 4X4 ST (CAST SUPPLIES) ×1 IMPLANT
PADDING CAST COTTON 3X4 STRL (CAST SUPPLIES) ×2
SET BASIN DAY SURGERY F.S. (CUSTOM PROCEDURE TRAY) ×3 IMPLANT
SLING ARM FOAM STRAP LRG (SOFTGOODS) ×3 IMPLANT
SPLINT PLASTER CAST XFAST 3X15 (CAST SUPPLIES) ×10 IMPLANT
SPLINT PLASTER XTRA FASTSET 3X (CAST SUPPLIES) ×20
STOCKINETTE 4X48 STRL (DRAPES) ×3 IMPLANT
STRIP CLOSURE SKIN 1/4X4 (GAUZE/BANDAGES/DRESSINGS) IMPLANT
SUT ETHILON 4 0 PS 2 18 (SUTURE) ×6 IMPLANT
SUT MNCRL AB 4-0 PS2 18 (SUTURE) IMPLANT
SUT VIC AB 3-0 FS2 27 (SUTURE) IMPLANT
SYR BULB EAR ULCER 3OZ GRN STR (SYRINGE) ×3 IMPLANT
SYR CONTROL 10ML LL (SYRINGE) IMPLANT
TOWEL GREEN STERILE FF (TOWEL DISPOSABLE) ×6 IMPLANT
UNDERPAD 30X36 HEAVY ABSORB (UNDERPADS AND DIAPERS) ×3 IMPLANT

## 2019-06-16 NOTE — Progress Notes (Signed)
Assisted Dr. Singer with right, ultrasound guided, supraclavicular block. Side rails up, monitors on throughout procedure. See vital signs in flow sheet. Tolerated Procedure well. 

## 2019-06-16 NOTE — Anesthesia Postprocedure Evaluation (Signed)
Anesthesia Post Note  Patient: Jamie Hurst  Procedure(s) Performed: HARDWARE REMOVAL RIGHT WRIST (Right Wrist)     Patient location during evaluation: PACU Anesthesia Type: MAC Level of consciousness: awake and alert Pain management: pain level controlled Vital Signs Assessment: post-procedure vital signs reviewed and stable Respiratory status: spontaneous breathing and respiratory function stable Cardiovascular status: stable Postop Assessment: no apparent nausea or vomiting Anesthetic complications: no   No complications documented.  Last Vitals:  Vitals:   06/16/19 1445 06/16/19 1524  BP: 139/71 (!) 152/83  Pulse: 63 69  Resp: 19 19  Temp:  36.4 C  SpO2: 94% 99%    Last Pain:  Vitals:   06/16/19 1119  TempSrc: Oral  PainSc: 0-No pain                 Meosha Castanon DANIEL

## 2019-06-16 NOTE — Anesthesia Procedure Notes (Signed)
Anesthesia Regional Block: Supraclavicular block   Pre-Anesthetic Checklist: ,, timeout performed, Correct Patient, Correct Site, Correct Laterality, Correct Procedure, Correct Position, site marked, Risks and benefits discussed,  Surgical consent,  Pre-op evaluation,  At surgeon's request and post-op pain management  Laterality: Right  Prep: chloraprep       Needles:  Injection technique: Single-shot  Needle Type: Echogenic Stimulator Needle     Needle Length: 5cm  Needle Gauge: 22     Additional Needles:   Narrative:  Start time: 06/16/2019 12:10 PM End time: 06/16/2019 12:20 PM Injection made incrementally with aspirations every 5 mL.  Performed by: Personally  Anesthesiologist: Duane Boston, MD  Additional Notes: Functioning IV was confirmed and monitors applied.  A 15mm 22ga echogenic arrow stimulator was used. Sterile prep and drape,hand hygiene and sterile gloves were used.Ultrasound guidance: relevant anatomy identified, needle position confirmed, local anesthetic spread visualized around nerve(s)., vascular puncture avoided.  Image printed for medical record.  Negative aspiration and negative test dose prior to incremental administration of local anesthetic. The patient tolerated the procedure well.

## 2019-06-16 NOTE — Discharge Instructions (Signed)
Wound Care: Keep your hand elevated above the level of your heart.  Do not allow it to dangle by your side.  Keep the dressing dry and do not remove it unless your doctor advises you to do so.  He will usually change it at the time of your post-op visit.  Moving your fingers is advised to stimulate circulation but will depend on the site of your surgery.  If you have a splint applied, your doctor will advise you regarding movement.  Activity: Do not drive or operate machinery today.  Rest today and then you may return to your normal activity and work as indicated by your physician.  Diet:  Drink liquids today or eat a light diet.  You may resume a regular diet tomorrow.    General expectations: Pain for two to three days. Fingers may become slightly swollen.  Call your doctor if any of the following occur: Severe pain not relieved by pain medication. Elevated temperature. Dressing soaked with blood. Inability to move fingers. White or bluish color to fingers.   Post Anesthesia Home Care Instructions  Activity: Get plenty of rest for the remainder of the day. A responsible individual must stay with you for 24 hours following the procedure.  For the next 24 hours, DO NOT: -Drive a car -Paediatric nurse -Drink alcoholic beverages -Take any medication unless instructed by your physician -Make any legal decisions or sign important papers.  Meals: Start with liquid foods such as gelatin or soup. Progress to regular foods as tolerated. Avoid greasy, spicy, heavy foods. If nausea and/or vomiting occur, drink only clear liquids until the nausea and/or vomiting subsides. Call your physician if vomiting continues.  Special Instructions/Symptoms: Your throat may feel dry or sore from the anesthesia or the breathing tube placed in your throat during surgery. If this causes discomfort, gargle with warm salt water. The discomfort should disappear within 24 hours.   Regional Anesthesia  Blocks  1. Numbness or the inability to move the "blocked" extremity may last from 3-48 hours after placement. The length of time depends on the medication injected and your individual response to the medication. If the numbness is not going away after 48 hours, call your surgeon.  2. The extremity that is blocked will need to be protected until the numbness is gone and the  Strength has returned. Because you cannot feel it, you will need to take extra care to avoid injury. Because it may be weak, you may have difficulty moving it or using it. You may not know what position it is in without looking at it while the block is in effect.  3. For blocks in the legs and feet, returning to weight bearing and walking needs to be done carefully. You will need to wait until the numbness is entirely gone and the strength has returned. You should be able to move your leg and foot normally before you try and bear weight or walk. You will need someone to be with you when you first try to ensure you do not fall and possibly risk injury.  4. Bruising and tenderness at the needle site are common side effects and will resolve in a few days.  5. Persistent numbness or new problems with movement should be communicated to the surgeon or the [facility] [phone] [place] [phone]).  Information for Discharge Teaching: EXPAREL (bupivacaine liposome injectable suspension)   Your surgeon or anesthesiologist gave you EXPAREL(bupivacaine) to help control your pain after  surgery.   EXPAREL is a local anesthetic that provides pain relief by numbing the tissue around the surgical site.  EXPAREL is designed to release pain medication over time and can control pain for up to 72 hours.  Depending on how you respond to EXPAREL, you may require less pain medication during your recovery.  Possible side effects:  Temporary loss of sensation or ability to move in the area where bupivacaine  was injected.  Nausea, vomiting, constipation  Rarely, numbness and tingling in your mouth or lips, lightheadedness, or anxiety may occur.  Call your doctor right away if you think you may be experiencing any of these sensations, or if you have other questions regarding possible side effects.  Follow all other discharge instructions given to you by your surgeon or nurse. Eat a healthy diet and drink plenty of water or other fluids.  If you return to the hospital for any reason within 96 hours following the administration of EXPAREL, it is important for health care providers to know that you have received this anesthetic. A teal colored band has been placed on your arm with the date, time and amount of EXPAREL you have received in order to alert and inform your health care providers. Please leave this armband in place for the full 96 hours following administration, and then you may remove the band.  Next dose of Tylenol can be given 5:30pm if needed. Next dose of Ibuprofen/Motrin can be given at 7:30pm if needed.

## 2019-06-16 NOTE — Transfer of Care (Signed)
Immediate Anesthesia Transfer of Care Note  Patient: Jamie Hurst  Procedure(s) Performed: HARDWARE REMOVAL RIGHT WRIST (Right Wrist)  Patient Location: PACU  Anesthesia Type:MAC combined with regional for post-op pain  Level of Consciousness: awake, alert  and oriented  Airway & Oxygen Therapy: Patient Spontanous Breathing and Patient connected to face mask oxygen  Post-op Assessment: Report given to RN and Post -op Vital signs reviewed and stable  Post vital signs: Reviewed and stable  Last Vitals:  Vitals Value Taken Time  BP 111/65   Temp    Pulse 66   Resp 16   SpO2 99%     Last Pain:  Vitals:   06/16/19 1119  TempSrc: Oral  PainSc: 0-No pain         Complications: No complications documented.

## 2019-06-16 NOTE — Anesthesia Preprocedure Evaluation (Addendum)
Anesthesia Evaluation  Patient identified by MRN, date of birth, ID band Patient awake    Reviewed: Allergy & Precautions, NPO status , Patient's Chart, lab work & pertinent test results  History of Anesthesia Complications Negative for: history of anesthetic complications  Airway Mallampati: II  TM Distance: >3 FB Neck ROM: Full    Dental no notable dental hx. (+) Dental Advisory Given   Pulmonary neg pulmonary ROS,    Pulmonary exam normal        Cardiovascular hypertension, Pt. on medications and Pt. 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 Saw Dr. Harl Bowie (Cardiology) 09/16/18 at which time she had no recent chest pain. Complained of palpitations but had 7 day event monitor which showed no significant dysrythmia    Neuro/Psych Seizures -,  PSYCHIATRIC DISORDERS Anxiety Depression    GI/Hepatic Neg liver ROS, GERD  ,  Endo/Other  Hypothyroidism   Renal/GU negative Renal ROS  negative genitourinary   Musculoskeletal negative musculoskeletal ROS (+)   Abdominal   Peds  Hematology negative hematology ROS (+)   Anesthesia Other Findings   Reproductive/Obstetrics                            Anesthesia Physical  Anesthesia Plan  ASA: III  Anesthesia Plan: MAC   Post-op Pain Management:  Regional for Post-op pain   Induction: Intravenous  PONV Risk Score and Plan: 2 and Propofol infusion, Treatment may vary due to age or medical condition and Ondansetron  Airway Management Planned: Natural Airway, Nasal Cannula and Simple Face Mask  Additional Equipment: None  Intra-op Plan:   Post-operative Plan:   Informed Consent: I have reviewed the patients History and Physical, chart, labs and discussed the procedure including the risks,  benefits and alternatives for the proposed anesthesia with the patient or authorized representative who has indicated his/her understanding and acceptance.     Dental advisory given  Plan Discussed with: Anesthesiologist, CRNA and Surgeon  Anesthesia Plan Comments:        Anesthesia Quick Evaluation

## 2019-06-16 NOTE — H&P (Signed)
Jamie Hurst is an 74 y.o. female.   Chief Complaint: right wrist retained hardware HPI: 74 yo female s/p ORIF right distal radius fracture with subsequent refracture and plating with bridge plate.  She has healed the fracture and wishes to have the bridge plate removed and possibly the volar plate as well.  Allergies: No Known Allergies  Past Medical History:  Diagnosis Date  . Allergic rhinitis   . 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 Anxiety   . CAD (coronary artery disease)   . Depression   . GERD (gastroesophageal reflux disease)   . HTN (hypertension) 06/13/2013  . Hypertension   . Hypothyroidism   . IBS (irritable bowel syndrome)   . Non Hodgkin's lymphoma ([place])   . Seizures ([place])    stress related hx of 1 seizure in 2012, last seizure 6 yrs ago  . Unspecified essential hypertension     Past Surgical History:  Procedure Laterality Date  . ABDOMINAL HYSTERECTOMY  1977  . KNEE ARTHROSCOPY WITH LATERAL MENISECTOMY Left 03/2018  . LAMINECTOMY  2013   x's 2  1st 2003 and 2nd 2013  . LEFT HEART CATHETERIZATION WITH CORONARY ANGIOGRAM N/A 06/14/2013   Procedure: LEFT HEART CATHETERIZATION WITH CORONARY ANGIOGRAM;  Surgeon: Peter M Martinique, MD;  Location: [facility] CATH LAB;  Service: Cardiovascular;  Laterality: N/A;  . OPEN REDUCTION INTERNAL FIXATION (ORIF) DISTAL RADIAL FRACTURE Right 04/18/2015   Procedure: OPEN REDUCTION INTERNAL FIXATION (ORIF) RIGHT  DISTAL RADIUS OSTEOTOMY;  Surgeon: Leanora Cover, MD;  Location: [place];  Service: Orthopedics;  Laterality: Right;  . OPEN REDUCTION INTERNAL FIXATION (ORIF) DISTAL RADIAL FRACTURE Right 02/09/2019   Procedure: OPEN REDUCTION INTERNAL FIXATION (ORIF) DISTAL RADIAL FRACTURE;  Surgeon: Leanora Cover, MD;  Location: [facility];  Service: Orthopedics;  Laterality: Right;  Open Reduction internal fixation right  distal radius ,brigdgeplate possible volar ligament repair  . THORACOTOMY  21   age 35   . ULNA OSTEOTOMY Right 08/27/2015   Procedure: Right wrist [place];  Surgeon: Leanora Cover, MD;  Location: [facility];  Service: Orthopedics;  Laterality: Right;  . WRIST CAPSULOTOMY      Family History: Family History  Problem Relation Age of Onset  . Heart disease Maternal Grandfather   . Diabetes Paternal Grandfather     Social History:   reports that she has never smoked. She has never used smokeless tobacco. She reports that she does not drink alcohol and does not use drugs.  Medications: Medications Prior to Admission  Medication Sig Dispense Refill  . amLODipine (NORVASC) 10 MG tablet Take 1 tablet (10 mg total) daily by mouth. 90 tablet 1  . aspirin EC 81 MG EC tablet Take 1 tablet (81 mg total) by mouth daily.    . diphenoxylate-atropine (LOMOTIL) 2.5-0.025 MG per tablet Take 2 tablets by mouth 3 (three) times daily as needed for diarrhea or loose stools.     Marland Kitchen FLUoxetine (PROZAC) 40 MG capsule Take 40 mg by mouth daily.    Marland Kitchen levETIRAcetam (KEPPRA) 500 MG tablet Take 1 tablet by mouth 2 (two) times daily.    Marland Kitchen levothyroxine (SYNTHROID) 112 MCG tablet Take 112 mcg by mouth daily before breakfast.    . lisinopril (ZESTRIL) 10 MG tablet Take 10 mg by mouth daily.    . metoprolol succinate (TOPROL-XL) 25 MG 24 hr tablet TAKE 1 TABLET BY MOUTH DAILY 90 tablet 1  . omeprazole (PRILOSEC) 40 MG capsule Take  40 mg by mouth daily.    . temazepam (RESTORIL) 15 MG capsule Take 15 mg by mouth at bedtime as needed for sleep.    Marland Kitchen topiramate (TOPAMAX) 50 MG tablet Take 1 tablet by mouth 2 (two) times daily as needed.      Results for orders placed or performed during the hospital encounter of 06/15/19 (from the past 48 hour(s))  SARS CORONAVIRUS 2 (TAT 6-24 HRS) Nasopharyngeal Nasopharyngeal Swab     Status: None   Collection Time: 06/15/19 11:05 AM   Specimen: Nasopharyngeal Swab  Result Value Ref Range   SARS Coronavirus 2 NEGATIVE NEGATIVE    Comment:  (NOTE) SARS-CoV-2 target nucleic acids are NOT DETECTED.  The SARS-CoV-2 RNA is generally detectable in upper and lower respiratory specimens during the acute phase of infection. Negative results do not preclude SARS-CoV-2 infection, do not rule out co-infections with other pathogens, and should not be used as the sole basis for treatment or other patient management decisions. Negative results must be combined with clinical observations, patient history, and epidemiological information. The expected result is Negative.  Fact Sheet for Patients: SugarRoll.be  Fact Sheet for Healthcare Providers: https://www.woods-mathews.com/  This test is not yet approved or cleared by the [place] FDA and  has been authorized for detection and/or diagnosis of SARS-CoV-2 by FDA under an Emergency Use Authorization (EUA). This EUA will remain  in effect (meaning this test can be used) for the duration of the COVID-19 declaration under Se ction 564(b)(1) of the Act, 21 U.S.C. section 360bbb-3(b)(1), unless the authorization is terminated or revoked sooner.  Performed at [facility], [place] [address] [place], [place] [address]     No results found.   A comprehensive review of systems was negative.  Blood pressure 123/69, pulse 79, temperature 97.8 F (36.6 C), temperature source Oral, resp. rate (!) 21, height 5' 6.5" (1.689 m), weight 84.2 kg, SpO2 100 %.  General appearance: alert, cooperative and appears stated age Head: Normocephalic, without obvious abnormality, atraumatic Neck: supple, symmetrical, trachea midline Cardio: regular rate and rhythm Resp: clear to auscultation bilaterally Extremities: Intact sensation and capillary refill all digits.  +epl/fpl/io.  No wounds.  Pulses: 2+ and symmetric Skin: Skin color, texture, turgor normal. No rashes or lesions Neurologic: Grossly normal Incision/Wound: none  Assessment/Plan Right  wrist retained hardware.  Plan removal bridge plate and possibly volar plate.  She states she has pain at the radial side of the wrist but no pain volarly at the wrist.  Risks, benefits, and alternatives of surgery have been discussed and the patient agrees with the plan of care.   Leanora Cover 06/16/2019, 1:03 PM

## 2019-06-16 NOTE — Op Note (Signed)
NAME: Jamie Hurst MEDICAL RECORD NO: [id number] DATE OF BIRTH: 1945-09-01 FACILITY: [facility] LOCATION: [facility] PHYSICIAN: Tennis Must, MD   OPERATIVE REPORT   DATE OF PROCEDURE: 06/16/19    PREOPERATIVE DIAGNOSIS:   Retained hardware right wrist   POSTOPERATIVE DIAGNOSIS:   Retained hardware right wrist   PROCEDURE:   Removal of deep retained bridge plate right wrist   SURGEON:  Leanora Cover, M.D.   ASSISTANT: none   ANESTHESIA:  Regional with sedation   INTRAVENOUS FLUIDS:  Per anesthesia flow sheet.   ESTIMATED BLOOD LOSS:  Minimal.   COMPLICATIONS:  None.   SPECIMENS:  none   TOURNIQUET TIME:    Total Tourniquet Time Documented: Upper Arm (Right) - 31 minutes Total: Upper Arm (Right) - 31 minutes    DISPOSITION:  Stable to PACU.   INDICATIONS: 74 year old female status post ORIF of right distal radius fracture with subsequent reinjury and placement of bridge plate.  She presents today for removal of the bridge plate and possibly volar plate. Risks, benefits and alternatives of surgery were discussed including the risks of blood loss, infection, damage to nerves, vessels, tendons, ligaments, bone for surgery, need for additional surgery, complications with wound healing, continued pain, nonunion, malunion, stiffness.  She voiced understanding of these risks and elected to proceed.  OPERATIVE COURSE:  After being identified preoperatively by myself,  the patient and I agreed on the procedure and site of the procedure.  The surgical site was marked.  Surgical consent had been signed. She was given IV antibiotics as preoperative antibiotic prophylaxis. She was transferred to the operating room and placed on the operating table in supine position with the Right upper extremity on an arm board.  Sedation was induced by the anesthesiologist. A regional block had been performed by anesthesia in preoperative holding.   Right upper extremity was prepped  and draped in normal sterile orthopedic fashion.  A surgical pause was performed between the surgeons, anesthesia, and operating room staff and all were in agreement as to the patient, procedure, and site of procedure.  Tourniquet at the proximal aspect of the extremity was inflated to 250 mmHg after exsanguination of the arm with an Esmarch bandage.    Incision was made from the previous incision over the long finger metacarpal.  This is carried into subcutaneous tissues by spreading technique.  The plate was identified.  The 3 distal screws were identified and removed with the screwdriver.  There is not significant bone growth around the plate.  Incision was made proximally over the plate.  Again this was carried in subcutaneous tissues by spreading technique and the plate identified.  The proximal 3 screws were again removed with the screwdriver.  The plate was able to be mobilized.  There was not significant bone growth around the plate.  An additional incision was made centrally over the bulge portion of the plate.  This was used to free up the scar tissue around the plate.  The plate was then able to be removed from the distal wound.  C-arm was used in AP and lateral projections to ensure complete removal of dorsal hardware which was the case.  The wounds were copiously irrigated with sterile saline.  They were closed with 4-0 and 3-0 nylon suture in an interrupted fashion.  The wounds were then dressed with sterile Xeroform 4 x 4's and wrapped with a Kerlix bandage.  Volar splint was placed and wrapped with Kerlix and Ace bandage.  The  tourniquet was deflated at 31 minutes.  Fingertips were pink with brisk capillary refill after deflation of tourniquet.  The operative  drapes were broken down.  The patient was awoken from anesthesia safely.  She was transferred back to the stretcher and taken to PACU in stable condition.  I will see her back in the office in 1 week for postoperative followup.  I will give her  a prescription for Norco 5/325 1-2 tabs PO q6 hours prn pain, dispense # 20.   Leanora Cover, MD Electronically signed, 06/16/19

## 2019-06-19 ENCOUNTER — Encounter (HOSPITAL_BASED_OUTPATIENT_CLINIC_OR_DEPARTMENT_OTHER): Payer: Self-pay | Admitting: Orthopedic Surgery

## 2019-06-26 DIAGNOSIS — Z9689 Presence of other specified functional implants: Secondary | ICD-10-CM | POA: Diagnosis not present

## 2019-06-26 DIAGNOSIS — S52571D Other intraarticular fracture of lower end of right radius, subsequent encounter for closed fracture with routine healing: Secondary | ICD-10-CM | POA: Diagnosis not present

## 2019-07-25 DIAGNOSIS — Z969 Presence of functional implant, unspecified: Secondary | ICD-10-CM | POA: Diagnosis not present

## 2019-07-25 DIAGNOSIS — S52571D Other intraarticular fracture of lower end of right radius, subsequent encounter for closed fracture with routine healing: Secondary | ICD-10-CM | POA: Diagnosis not present

## 2019-08-10 DIAGNOSIS — Z96652 Presence of left artificial knee joint: Secondary | ICD-10-CM | POA: Diagnosis not present

## 2019-09-19 DIAGNOSIS — S60211A Contusion of right wrist, initial encounter: Secondary | ICD-10-CM | POA: Diagnosis not present

## 2019-09-19 DIAGNOSIS — M79641 Pain in right hand: Secondary | ICD-10-CM | POA: Diagnosis not present

## 2019-09-28 DIAGNOSIS — I10 Essential (primary) hypertension: Secondary | ICD-10-CM | POA: Diagnosis not present

## 2019-09-28 DIAGNOSIS — Z299 Encounter for prophylactic measures, unspecified: Secondary | ICD-10-CM | POA: Diagnosis not present

## 2019-09-28 DIAGNOSIS — R197 Diarrhea, unspecified: Secondary | ICD-10-CM | POA: Diagnosis not present

## 2019-09-28 DIAGNOSIS — F419 Anxiety disorder, unspecified: Secondary | ICD-10-CM | POA: Diagnosis not present

## 2019-09-28 DIAGNOSIS — Z6833 Body mass index (BMI) 33.0-33.9, adult: Secondary | ICD-10-CM | POA: Diagnosis not present

## 2019-09-28 DIAGNOSIS — C8590 Non-Hodgkin lymphoma, unspecified, unspecified site: Secondary | ICD-10-CM | POA: Diagnosis not present

## 2019-10-31 DIAGNOSIS — Z0000 Encounter for general adult medical examination without abnormal findings: Secondary | ICD-10-CM | POA: Diagnosis not present

## 2019-10-31 DIAGNOSIS — E7800 Pure hypercholesterolemia, unspecified: Secondary | ICD-10-CM | POA: Diagnosis not present

## 2019-10-31 DIAGNOSIS — Z79899 Other long term (current) drug therapy: Secondary | ICD-10-CM | POA: Diagnosis not present

## 2019-10-31 DIAGNOSIS — Z1331 Encounter for screening for depression: Secondary | ICD-10-CM | POA: Diagnosis not present

## 2019-10-31 DIAGNOSIS — Z1339 Encounter for screening examination for other mental health and behavioral disorders: Secondary | ICD-10-CM | POA: Diagnosis not present

## 2019-10-31 DIAGNOSIS — Z23 Encounter for immunization: Secondary | ICD-10-CM | POA: Diagnosis not present

## 2019-10-31 DIAGNOSIS — Z7189 Other specified counseling: Secondary | ICD-10-CM | POA: Diagnosis not present

## 2019-10-31 DIAGNOSIS — E039 Hypothyroidism, unspecified: Secondary | ICD-10-CM | POA: Diagnosis not present

## 2019-10-31 DIAGNOSIS — E559 Vitamin D deficiency, unspecified: Secondary | ICD-10-CM | POA: Diagnosis not present

## 2019-10-31 DIAGNOSIS — Z299 Encounter for prophylactic measures, unspecified: Secondary | ICD-10-CM | POA: Diagnosis not present

## 2019-10-31 DIAGNOSIS — I10 Essential (primary) hypertension: Secondary | ICD-10-CM | POA: Diagnosis not present

## 2019-10-31 DIAGNOSIS — Z6831 Body mass index (BMI) 31.0-31.9, adult: Secondary | ICD-10-CM | POA: Diagnosis not present

## 2019-11-03 DIAGNOSIS — I10 Essential (primary) hypertension: Secondary | ICD-10-CM | POA: Diagnosis not present

## 2019-11-03 DIAGNOSIS — Z299 Encounter for prophylactic measures, unspecified: Secondary | ICD-10-CM | POA: Diagnosis not present

## 2019-11-03 DIAGNOSIS — C8590 Non-Hodgkin lymphoma, unspecified, unspecified site: Secondary | ICD-10-CM | POA: Diagnosis not present

## 2019-11-03 DIAGNOSIS — E559 Vitamin D deficiency, unspecified: Secondary | ICD-10-CM | POA: Diagnosis not present

## 2019-11-03 DIAGNOSIS — Z6831 Body mass index (BMI) 31.0-31.9, adult: Secondary | ICD-10-CM | POA: Diagnosis not present

## 2019-11-03 DIAGNOSIS — E039 Hypothyroidism, unspecified: Secondary | ICD-10-CM | POA: Diagnosis not present

## 2020-01-09 DIAGNOSIS — I10 Essential (primary) hypertension: Secondary | ICD-10-CM | POA: Diagnosis not present

## 2020-01-09 DIAGNOSIS — Z299 Encounter for prophylactic measures, unspecified: Secondary | ICD-10-CM | POA: Diagnosis not present

## 2020-01-09 DIAGNOSIS — Z6831 Body mass index (BMI) 31.0-31.9, adult: Secondary | ICD-10-CM | POA: Diagnosis not present

## 2020-01-09 DIAGNOSIS — Z20822 Contact with and (suspected) exposure to covid-19: Secondary | ICD-10-CM | POA: Diagnosis not present

## 2020-01-09 DIAGNOSIS — Z789 Other specified health status: Secondary | ICD-10-CM | POA: Diagnosis not present

## 2020-01-11 DIAGNOSIS — A4189 Other specified sepsis: Secondary | ICD-10-CM | POA: Diagnosis not present

## 2020-01-11 DIAGNOSIS — K219 Gastro-esophageal reflux disease without esophagitis: Secondary | ICD-10-CM | POA: Diagnosis present

## 2020-01-11 DIAGNOSIS — I959 Hypotension, unspecified: Secondary | ICD-10-CM | POA: Diagnosis not present

## 2020-01-11 DIAGNOSIS — F32A Depression, unspecified: Secondary | ICD-10-CM | POA: Diagnosis present

## 2020-01-11 DIAGNOSIS — Z4682 Encounter for fitting and adjustment of non-vascular catheter: Secondary | ICD-10-CM | POA: Diagnosis not present

## 2020-01-11 DIAGNOSIS — R0902 Hypoxemia: Secondary | ICD-10-CM | POA: Diagnosis not present

## 2020-01-11 DIAGNOSIS — E039 Hypothyroidism, unspecified: Secondary | ICD-10-CM | POA: Diagnosis present

## 2020-01-11 DIAGNOSIS — Z8572 Personal history of non-Hodgkin lymphomas: Secondary | ICD-10-CM | POA: Diagnosis not present

## 2020-01-11 DIAGNOSIS — R059 Cough, unspecified: Secondary | ICD-10-CM | POA: Diagnosis not present

## 2020-01-11 DIAGNOSIS — Z7952 Long term (current) use of systemic steroids: Secondary | ICD-10-CM | POA: Diagnosis not present

## 2020-01-11 DIAGNOSIS — R0602 Shortness of breath: Secondary | ICD-10-CM | POA: Diagnosis not present

## 2020-01-11 DIAGNOSIS — Z7982 Long term (current) use of aspirin: Secondary | ICD-10-CM | POA: Diagnosis not present

## 2020-01-11 DIAGNOSIS — R7402 Elevation of levels of lactic acid dehydrogenase (LDH): Secondary | ICD-10-CM | POA: Diagnosis not present

## 2020-01-11 DIAGNOSIS — E875 Hyperkalemia: Secondary | ICD-10-CM | POA: Diagnosis not present

## 2020-01-11 DIAGNOSIS — U071 COVID-19: Secondary | ICD-10-CM | POA: Diagnosis not present

## 2020-01-11 DIAGNOSIS — Z79890 Hormone replacement therapy (postmenopausal): Secondary | ICD-10-CM | POA: Diagnosis not present

## 2020-01-11 DIAGNOSIS — I4891 Unspecified atrial fibrillation: Secondary | ICD-10-CM | POA: Diagnosis present

## 2020-01-11 DIAGNOSIS — N179 Acute kidney failure, unspecified: Secondary | ICD-10-CM | POA: Diagnosis not present

## 2020-01-11 DIAGNOSIS — R918 Other nonspecific abnormal finding of lung field: Secondary | ICD-10-CM | POA: Diagnosis not present

## 2020-01-11 DIAGNOSIS — J9601 Acute respiratory failure with hypoxia: Secondary | ICD-10-CM | POA: Diagnosis present

## 2020-01-11 DIAGNOSIS — J9600 Acute respiratory failure, unspecified whether with hypoxia or hypercapnia: Secondary | ICD-10-CM | POA: Diagnosis not present

## 2020-01-11 DIAGNOSIS — J90 Pleural effusion, not elsewhere classified: Secondary | ICD-10-CM | POA: Diagnosis not present

## 2020-01-11 DIAGNOSIS — J1282 Pneumonia due to coronavirus disease 2019: Secondary | ICD-10-CM | POA: Diagnosis present

## 2020-01-11 DIAGNOSIS — R6520 Severe sepsis without septic shock: Secondary | ICD-10-CM | POA: Diagnosis present

## 2020-01-11 DIAGNOSIS — I4819 Other persistent atrial fibrillation: Secondary | ICD-10-CM | POA: Diagnosis not present

## 2020-01-11 DIAGNOSIS — I10 Essential (primary) hypertension: Secondary | ICD-10-CM | POA: Diagnosis present

## 2020-01-11 DIAGNOSIS — B349 Viral infection, unspecified: Secondary | ICD-10-CM | POA: Diagnosis not present

## 2020-01-11 DIAGNOSIS — Z452 Encounter for adjustment and management of vascular access device: Secondary | ICD-10-CM | POA: Diagnosis not present

## 2020-01-11 DIAGNOSIS — Z6831 Body mass index (BMI) 31.0-31.9, adult: Secondary | ICD-10-CM | POA: Diagnosis not present

## 2020-01-11 DIAGNOSIS — R7989 Other specified abnormal findings of blood chemistry: Secondary | ICD-10-CM | POA: Diagnosis not present

## 2020-01-11 DIAGNOSIS — E876 Hypokalemia: Secondary | ICD-10-CM | POA: Diagnosis not present

## 2020-01-12 DIAGNOSIS — R059 Cough, unspecified: Secondary | ICD-10-CM | POA: Diagnosis not present

## 2020-01-12 DIAGNOSIS — U071 COVID-19: Secondary | ICD-10-CM | POA: Diagnosis not present

## 2020-01-12 DIAGNOSIS — R0602 Shortness of breath: Secondary | ICD-10-CM | POA: Diagnosis not present

## 2020-01-12 DIAGNOSIS — I959 Hypotension, unspecified: Secondary | ICD-10-CM | POA: Diagnosis not present

## 2020-01-12 DIAGNOSIS — J90 Pleural effusion, not elsewhere classified: Secondary | ICD-10-CM | POA: Diagnosis not present

## 2020-01-12 DIAGNOSIS — E876 Hypokalemia: Secondary | ICD-10-CM | POA: Diagnosis not present

## 2020-01-13 DIAGNOSIS — I4891 Unspecified atrial fibrillation: Secondary | ICD-10-CM | POA: Diagnosis present

## 2020-01-13 DIAGNOSIS — Z8572 Personal history of non-Hodgkin lymphomas: Secondary | ICD-10-CM | POA: Diagnosis not present

## 2020-01-13 DIAGNOSIS — R7989 Other specified abnormal findings of blood chemistry: Secondary | ICD-10-CM | POA: Diagnosis not present

## 2020-01-13 DIAGNOSIS — R7402 Elevation of levels of lactic acid dehydrogenase (LDH): Secondary | ICD-10-CM | POA: Diagnosis not present

## 2020-01-13 DIAGNOSIS — R6520 Severe sepsis without septic shock: Secondary | ICD-10-CM | POA: Diagnosis present

## 2020-01-13 DIAGNOSIS — Z6831 Body mass index (BMI) 31.0-31.9, adult: Secondary | ICD-10-CM | POA: Diagnosis not present

## 2020-01-13 DIAGNOSIS — U071 COVID-19: Secondary | ICD-10-CM | POA: Diagnosis present

## 2020-01-13 DIAGNOSIS — F32A Depression, unspecified: Secondary | ICD-10-CM | POA: Diagnosis present

## 2020-01-13 DIAGNOSIS — K219 Gastro-esophageal reflux disease without esophagitis: Secondary | ICD-10-CM | POA: Diagnosis present

## 2020-01-13 DIAGNOSIS — E876 Hypokalemia: Secondary | ICD-10-CM | POA: Diagnosis present

## 2020-01-13 DIAGNOSIS — J9601 Acute respiratory failure with hypoxia: Secondary | ICD-10-CM | POA: Diagnosis present

## 2020-01-13 DIAGNOSIS — E875 Hyperkalemia: Secondary | ICD-10-CM | POA: Diagnosis not present

## 2020-01-13 DIAGNOSIS — I4819 Other persistent atrial fibrillation: Secondary | ICD-10-CM | POA: Diagnosis not present

## 2020-01-13 DIAGNOSIS — E039 Hypothyroidism, unspecified: Secondary | ICD-10-CM | POA: Diagnosis present

## 2020-01-13 DIAGNOSIS — Z7982 Long term (current) use of aspirin: Secondary | ICD-10-CM | POA: Diagnosis not present

## 2020-01-13 DIAGNOSIS — Z79890 Hormone replacement therapy (postmenopausal): Secondary | ICD-10-CM | POA: Diagnosis not present

## 2020-01-13 DIAGNOSIS — Z4682 Encounter for fitting and adjustment of non-vascular catheter: Secondary | ICD-10-CM | POA: Diagnosis not present

## 2020-01-13 DIAGNOSIS — Z7952 Long term (current) use of systemic steroids: Secondary | ICD-10-CM | POA: Diagnosis not present

## 2020-01-13 DIAGNOSIS — J9600 Acute respiratory failure, unspecified whether with hypoxia or hypercapnia: Secondary | ICD-10-CM | POA: Diagnosis not present

## 2020-01-13 DIAGNOSIS — I959 Hypotension, unspecified: Secondary | ICD-10-CM | POA: Diagnosis present

## 2020-01-13 DIAGNOSIS — Z452 Encounter for adjustment and management of vascular access device: Secondary | ICD-10-CM | POA: Diagnosis not present

## 2020-01-13 DIAGNOSIS — A4189 Other specified sepsis: Secondary | ICD-10-CM | POA: Diagnosis present

## 2020-01-13 DIAGNOSIS — I10 Essential (primary) hypertension: Secondary | ICD-10-CM | POA: Diagnosis not present

## 2020-01-13 DIAGNOSIS — J1282 Pneumonia due to coronavirus disease 2019: Secondary | ICD-10-CM | POA: Diagnosis present

## 2020-01-13 DIAGNOSIS — R0902 Hypoxemia: Secondary | ICD-10-CM | POA: Diagnosis not present

## 2020-01-13 DIAGNOSIS — N179 Acute kidney failure, unspecified: Secondary | ICD-10-CM | POA: Diagnosis not present

## 2020-01-13 DIAGNOSIS — R918 Other nonspecific abnormal finding of lung field: Secondary | ICD-10-CM | POA: Diagnosis not present

## 2020-01-20 DIAGNOSIS — J80 Acute respiratory distress syndrome: Secondary | ICD-10-CM | POA: Diagnosis present

## 2020-01-20 DIAGNOSIS — Z4682 Encounter for fitting and adjustment of non-vascular catheter: Secondary | ICD-10-CM | POA: Diagnosis not present

## 2020-01-20 DIAGNOSIS — I82451 Acute embolism and thrombosis of right peroneal vein: Secondary | ICD-10-CM | POA: Diagnosis not present

## 2020-01-20 DIAGNOSIS — J984 Other disorders of lung: Secondary | ICD-10-CM | POA: Diagnosis not present

## 2020-01-20 DIAGNOSIS — Z9911 Dependence on respirator [ventilator] status: Secondary | ICD-10-CM | POA: Diagnosis not present

## 2020-01-20 DIAGNOSIS — R609 Edema, unspecified: Secondary | ICD-10-CM | POA: Diagnosis not present

## 2020-01-20 DIAGNOSIS — I82409 Acute embolism and thrombosis of unspecified deep veins of unspecified lower extremity: Secondary | ICD-10-CM | POA: Diagnosis not present

## 2020-01-20 DIAGNOSIS — J9602 Acute respiratory failure with hypercapnia: Secondary | ICD-10-CM | POA: Diagnosis not present

## 2020-01-20 DIAGNOSIS — Z452 Encounter for adjustment and management of vascular access device: Secondary | ICD-10-CM | POA: Diagnosis not present

## 2020-01-20 DIAGNOSIS — J9600 Acute respiratory failure, unspecified whether with hypoxia or hypercapnia: Secondary | ICD-10-CM | POA: Diagnosis not present

## 2020-01-20 DIAGNOSIS — D696 Thrombocytopenia, unspecified: Secondary | ICD-10-CM | POA: Diagnosis present

## 2020-01-20 DIAGNOSIS — R404 Transient alteration of awareness: Secondary | ICD-10-CM | POA: Diagnosis not present

## 2020-01-20 DIAGNOSIS — G40909 Epilepsy, unspecified, not intractable, without status epilepticus: Secondary | ICD-10-CM | POA: Diagnosis present

## 2020-01-20 DIAGNOSIS — K922 Gastrointestinal hemorrhage, unspecified: Secondary | ICD-10-CM | POA: Diagnosis not present

## 2020-01-20 DIAGNOSIS — E872 Acidosis: Secondary | ICD-10-CM | POA: Diagnosis present

## 2020-01-20 DIAGNOSIS — E785 Hyperlipidemia, unspecified: Secondary | ICD-10-CM | POA: Diagnosis present

## 2020-01-20 DIAGNOSIS — J9622 Acute and chronic respiratory failure with hypercapnia: Secondary | ICD-10-CM | POA: Diagnosis not present

## 2020-01-20 DIAGNOSIS — R195 Other fecal abnormalities: Secondary | ICD-10-CM | POA: Diagnosis not present

## 2020-01-20 DIAGNOSIS — D62 Acute posthemorrhagic anemia: Secondary | ICD-10-CM | POA: Diagnosis not present

## 2020-01-20 DIAGNOSIS — Z66 Do not resuscitate: Secondary | ICD-10-CM | POA: Diagnosis not present

## 2020-01-20 DIAGNOSIS — E870 Hyperosmolality and hypernatremia: Secondary | ICD-10-CM | POA: Diagnosis not present

## 2020-01-20 DIAGNOSIS — R079 Chest pain, unspecified: Secondary | ICD-10-CM | POA: Diagnosis not present

## 2020-01-20 DIAGNOSIS — N179 Acute kidney failure, unspecified: Secondary | ICD-10-CM | POA: Diagnosis not present

## 2020-01-20 DIAGNOSIS — J9621 Acute and chronic respiratory failure with hypoxia: Secondary | ICD-10-CM | POA: Diagnosis not present

## 2020-01-20 DIAGNOSIS — F419 Anxiety disorder, unspecified: Secondary | ICD-10-CM | POA: Diagnosis present

## 2020-01-20 DIAGNOSIS — K920 Hematemesis: Secondary | ICD-10-CM | POA: Diagnosis not present

## 2020-01-20 DIAGNOSIS — N170 Acute kidney failure with tubular necrosis: Secondary | ICD-10-CM | POA: Diagnosis present

## 2020-01-20 DIAGNOSIS — R0902 Hypoxemia: Secondary | ICD-10-CM | POA: Diagnosis not present

## 2020-01-20 DIAGNOSIS — Z6841 Body Mass Index (BMI) 40.0 and over, adult: Secondary | ICD-10-CM | POA: Diagnosis not present

## 2020-01-20 DIAGNOSIS — A419 Sepsis, unspecified organism: Secondary | ICD-10-CM | POA: Diagnosis not present

## 2020-01-20 DIAGNOSIS — D500 Iron deficiency anemia secondary to blood loss (chronic): Secondary | ICD-10-CM | POA: Diagnosis not present

## 2020-01-20 DIAGNOSIS — J1282 Pneumonia due to coronavirus disease 2019: Secondary | ICD-10-CM | POA: Diagnosis present

## 2020-01-20 DIAGNOSIS — J189 Pneumonia, unspecified organism: Secondary | ICD-10-CM | POA: Diagnosis not present

## 2020-01-20 DIAGNOSIS — J811 Chronic pulmonary edema: Secondary | ICD-10-CM | POA: Diagnosis present

## 2020-01-20 DIAGNOSIS — I10 Essential (primary) hypertension: Secondary | ICD-10-CM | POA: Diagnosis present

## 2020-01-20 DIAGNOSIS — F32A Depression, unspecified: Secondary | ICD-10-CM | POA: Diagnosis present

## 2020-01-20 DIAGNOSIS — E871 Hypo-osmolality and hyponatremia: Secondary | ICD-10-CM | POA: Diagnosis present

## 2020-01-20 DIAGNOSIS — K2100 Gastro-esophageal reflux disease with esophagitis, without bleeding: Secondary | ICD-10-CM | POA: Diagnosis not present

## 2020-01-20 DIAGNOSIS — J9601 Acute respiratory failure with hypoxia: Secondary | ICD-10-CM | POA: Diagnosis not present

## 2020-01-20 DIAGNOSIS — U071 COVID-19: Secondary | ICD-10-CM | POA: Diagnosis present

## 2020-01-20 DIAGNOSIS — I4891 Unspecified atrial fibrillation: Secondary | ICD-10-CM | POA: Diagnosis present

## 2020-01-20 DIAGNOSIS — R6521 Severe sepsis with septic shock: Secondary | ICD-10-CM | POA: Diagnosis present

## 2020-01-20 DIAGNOSIS — N183 Chronic kidney disease, stage 3 unspecified: Secondary | ICD-10-CM | POA: Diagnosis not present

## 2020-01-20 DIAGNOSIS — K219 Gastro-esophageal reflux disease without esophagitis: Secondary | ICD-10-CM | POA: Diagnosis present

## 2020-01-20 DIAGNOSIS — A4189 Other specified sepsis: Secondary | ICD-10-CM | POA: Diagnosis present

## 2020-01-20 DIAGNOSIS — K2970 Gastritis, unspecified, without bleeding: Secondary | ICD-10-CM | POA: Diagnosis not present

## 2020-01-20 DIAGNOSIS — I480 Paroxysmal atrial fibrillation: Secondary | ICD-10-CM | POA: Diagnosis not present

## 2020-01-20 DIAGNOSIS — Z515 Encounter for palliative care: Secondary | ICD-10-CM | POA: Diagnosis not present

## 2020-01-20 DIAGNOSIS — K254 Chronic or unspecified gastric ulcer with hemorrhage: Secondary | ICD-10-CM | POA: Diagnosis present

## 2020-01-20 DIAGNOSIS — E875 Hyperkalemia: Secondary | ICD-10-CM | POA: Diagnosis present

## 2020-01-20 DIAGNOSIS — E876 Hypokalemia: Secondary | ICD-10-CM | POA: Diagnosis present

## 2020-02-06 DEATH — deceased

## 2020-08-03 IMAGING — CT CT WRIST*R* W/O CM
1 of 2 series · 9 of 14 positions shown, 12 images · non-contrast
Comparison: None recent. Wrist radiographs 11/19/2014. Wrist MRI
07/29/2015.

CLINICAL DATA: Closed die punch fracture of the distal radius
02/07/2019 with surgery 02/09/2019.

EXAM:
CT OF THE RIGHT WRIST WITHOUT CONTRAST
TECHNIQUE: Multidetector CT imaging of the right wrist was performed according
to the standard protocol. Multiplanar CT image reconstructions were
also generated.

[Series 6: thin soft · axial · 0.48mm/px · z∈[+85,+230]mm · 9 of 302 slices shown, 12 images]
[im 31/302  soft-tissue]
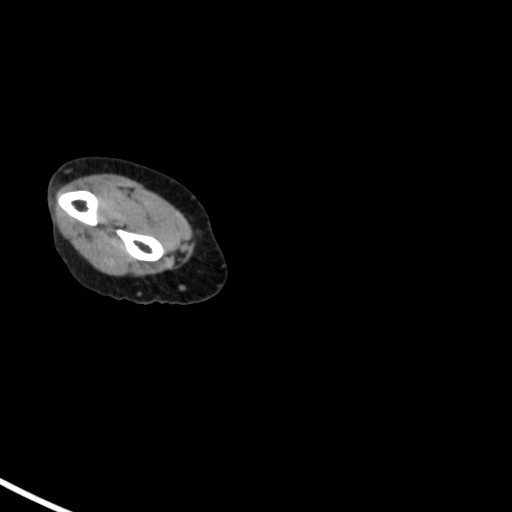
[im 31/302  bone]
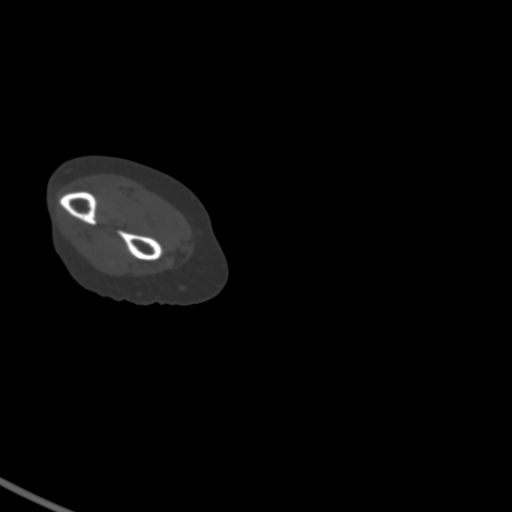
[im 61/302  bone]
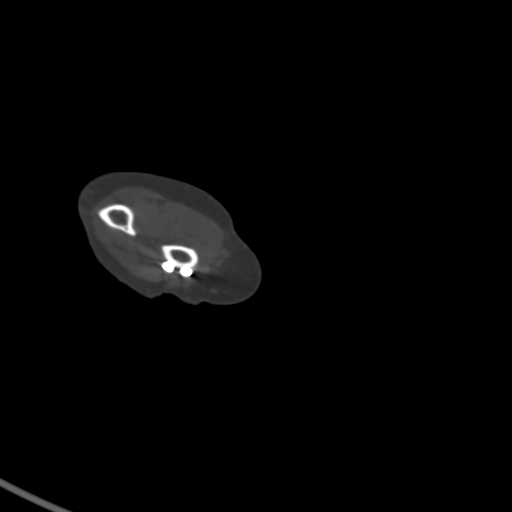
[im 91/302  bone]
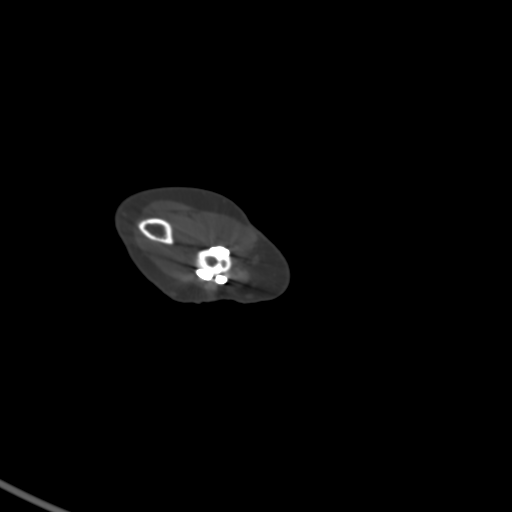
[im 121/302  bone]
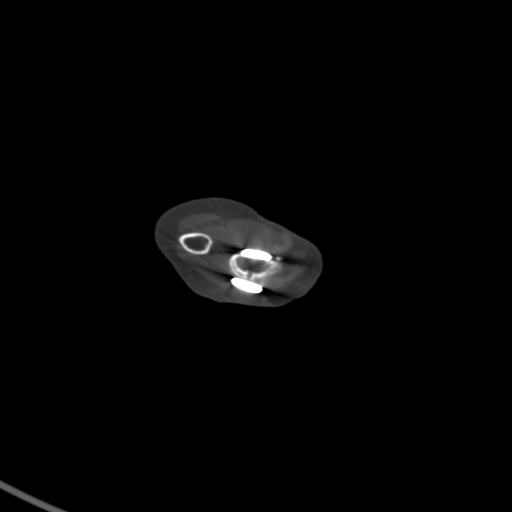
[im 151/302  soft-tissue]
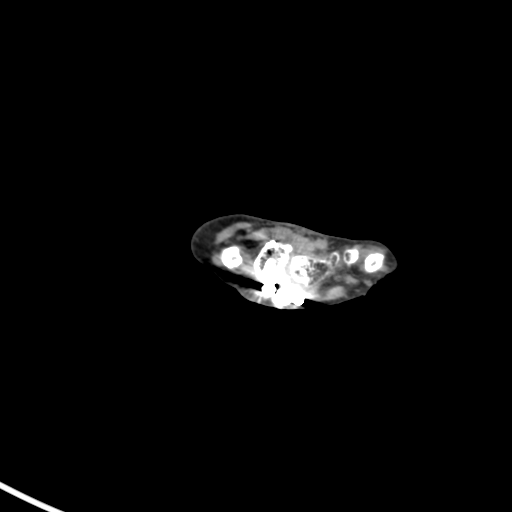
[im 151/302  bone]
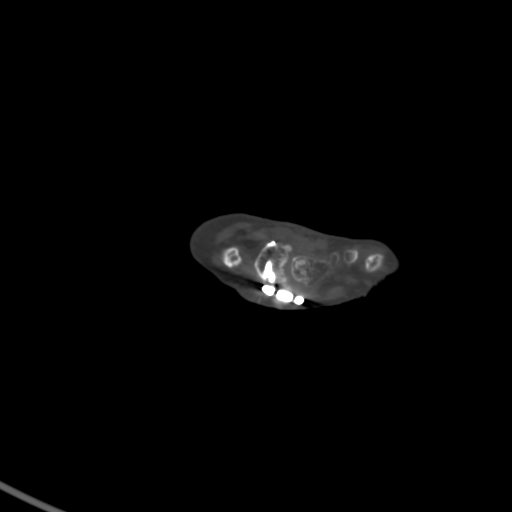
[im 181/302  bone]
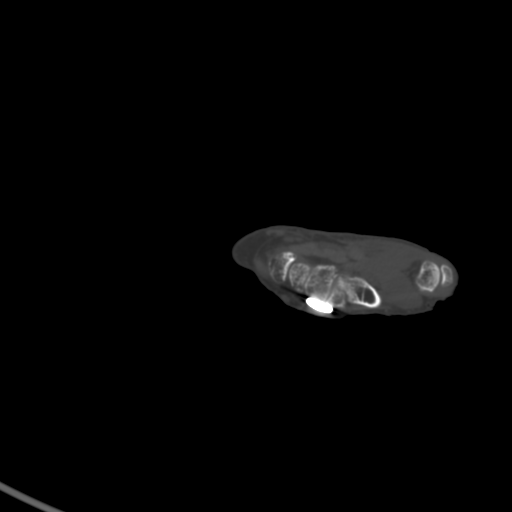
[im 211/302  bone]
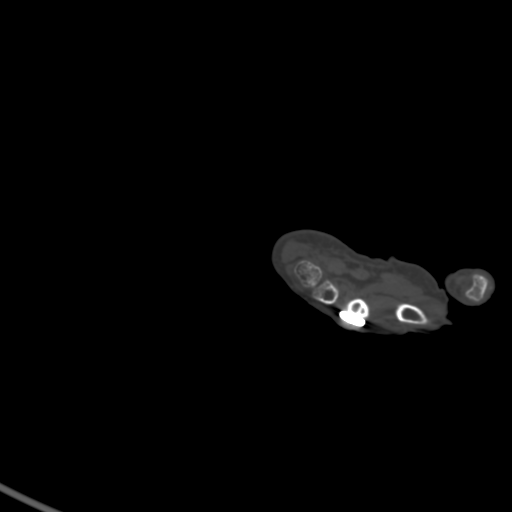
[im 241/302  bone]
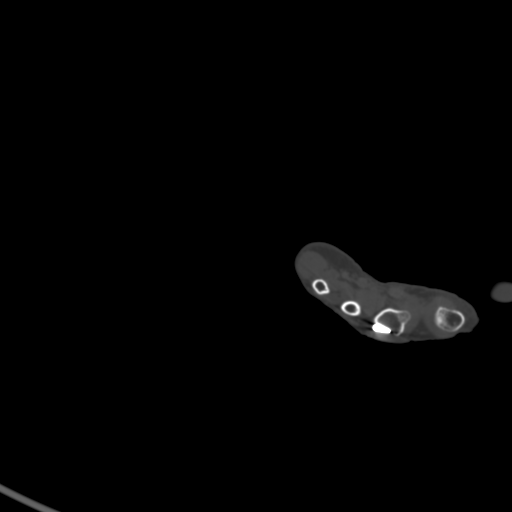
[im 271/302  soft-tissue]
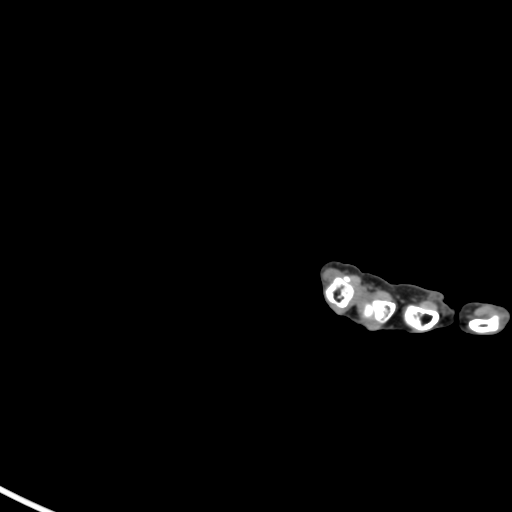
[im 271/302  bone]
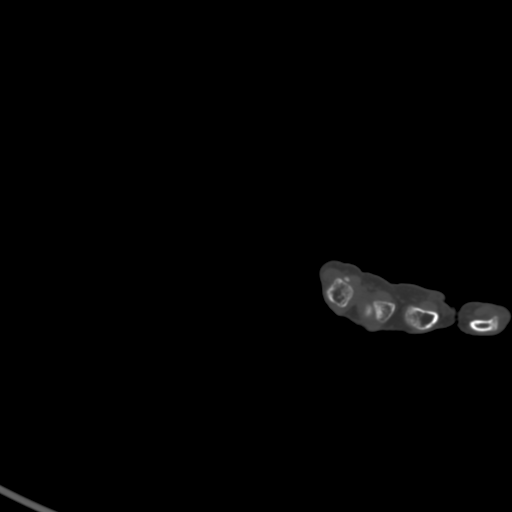

[9 of 14 positions shown; findings below may reference images not displayed]

FINDINGS: Bones/Joint/Cartilage

Status post remote volar plate and screw fixation of the distal
radius. This hardware is intact without loosening. More recent
placement of a dorsal plate and screws extending from the distal
radial metaphysis to the distal 3rd metacarpal. This hardware also
appears intact without loosening. The distal ulna has been resected.
There is posttraumatic deformity of the distal radius with mild
articular surface irregularity and up to 3 mm of cortical step-off
adjacent to the proximal pole of the scaphoid (image [DATE]). No
evidence of carpal bone fracture or osteonecrosis. There are mild
intercarpal degenerative changes. There are small intra-articular
loose bodies, largest along the volar aspect the radiocarpal joint.

Ligaments

Suboptimally assessed by CT.

Muscles and Tendons

The wrist tendons are partly obscured by artifact from the hardware,
especially the extensor tendons. The flexor tendons appear
unremarkable. No focal muscular atrophy.

Soft tissues

No soft tissue foreign bodies or fluid collections.
IMPRESSION: 1. No evidence of hardware loosening status post remote volar and
more recent dorsal plate and screw fixation.
2. Posttraumatic deformity of the distal radius with mild articular
surface irregularity and up to 3 mm of cortical step-off adjacent to
the proximal pole of the scaphoid. Small intra-articular loose
bodies.
3. No acute osseous or soft tissue findings identified.
